# Patient Record
Sex: Male | Born: 1990
Health system: Southern US, Community
[De-identification: ages and names within clinical notes are randomized; demographics above are authoritative.]

## PROBLEM LIST (undated history)

## (undated) ENCOUNTER — Emergency Department (HOSPITAL_COMMUNITY): Admission: EM | Payer: BC Managed Care – PPO | Source: Home / Self Care

## (undated) DIAGNOSIS — D6800 Von Willebrand disease, unspecified: Secondary | ICD-10-CM

## (undated) HISTORY — DX: Von Willebrand's disease: D68.0

## (undated) HISTORY — DX: Von Willebrand disease, unspecified: D68.00

---

## 2012-02-08 ENCOUNTER — Encounter (HOSPITAL_COMMUNITY): Payer: Self-pay

## 2012-02-08 ENCOUNTER — Emergency Department (INDEPENDENT_AMBULATORY_CARE_PROVIDER_SITE_OTHER): Payer: Self-pay

## 2012-02-08 ENCOUNTER — Emergency Department (HOSPITAL_COMMUNITY)
Admission: EM | Admit: 2012-02-08 | Discharge: 2012-02-08 | Disposition: A | Discharge status: Home / Self Care | Payer: Self-pay | Source: Home / Self Care | Attending: Emergency Medicine | Admitting: Emergency Medicine

## 2012-02-08 DIAGNOSIS — S92919A Unspecified fracture of unspecified toe(s), initial encounter for closed fracture: Secondary | ICD-10-CM

## 2012-02-08 DIAGNOSIS — S92513A Displaced fracture of proximal phalanx of unspecified lesser toe(s), initial encounter for closed fracture: Secondary | ICD-10-CM

## 2012-02-08 MED ORDER — TRAMADOL HCL 50 MG PO TABS: 100.0000 mg | ORAL_TABLET | Freq: Three times a day (TID) | ORAL | Status: AC | PRN

## 2012-02-08 NOTE — ED Notes (Signed)
Pt was playing with his dog and hit lt foot on table, pain, bruising and swelling of lt third, fourth and fifth toe.

## 2012-02-08 NOTE — ED Provider Notes (Signed)
Chief Complaint  Patient presents with  . Toe Pain    History of Present Illness:   The patient is a 21 year old male who injured his left foot last night while playing with his dog at home. He struck his foot against a piece of furniture and since then has had pain, swelling, and bruising at the base of the third fourth and fifth toes extending up into the toes and pain with movement of the toes he has pain with ambulation. It keeps him awake at night.  Review of Systems:  Other than noted above, the patient denies any of the following symptoms: Systemic:  No fevers, chills, sweats, or aches.  No fatigue or tiredness. Musculoskeletal:  No joint pain, arthritis, bursitis, swelling, back pain, or neck pain. Neurological:  No muscular weakness, paresthesias, headache, or trouble with speech or coordination.  No dizziness.   PMFSH:  Past medical history, family history, social history, meds, and allergies were reviewed.  Physical Exam:   Vital signs:  BP 158/69  Pulse 118  Temp(Src) 98.1 F (36.7 C) (Oral)  Resp 14  SpO2 98% Gen:  Alert and oriented times 3.  In no distress. Musculoskeletal: There was bruising, swelling, and pain to palpation over the third, fourth, and fifth toes extending up to the metatarsals. Otherwise, all joints had a full a ROM with no swelling, bruising or deformity.  No edema, pulses full. Extremities were warm and pink.  Capillary refill was brisk.  Skin:  Clear, warm and dry.  No rash. Neuro:  Alert and oriented times 3.  Muscle strength was normal.  Sensation was intact to light touch.   Radiology:  Dg Foot Complete Left  02/08/2012  *RADIOLOGY REPORT*  Clinical Data: Injured left foot yesterday.  Persistent pain and bruising.  LEFT FOOT - COMPLETE 3+ VIEW 02/08/2012:  Comparison: None.  Findings: Comminuted fracture involving the base of proximal phalanx of the 4th and 5th toes, extending to the articular surface.  No other fractures.  Well-preserved bone  mineral density. Well-preserved joint spaces.  IMPRESSION: Comminuted fractures involving the bases of the proximal phalanges of the 4th and 5th toes.  Original Report Authenticated By: Arnell Sieving, M.D.   Course in Urgent Care Center:   The 3 toes were buddy taped and the patient was put in a postop boot.  Assessment:   Diagnoses that have been ruled out:  None  Diagnoses that are still under consideration:  None  Final diagnoses:  Displaced fracture of proximal phalanx of lesser toe    Plan:   1.  The following meds were prescribed:   New Prescriptions   TRAMADOL (ULTRAM) 50 MG TABLET    Take 2 tablets (100 mg total) by mouth every 8 (eight) hours as needed for pain.   2.  The patient was instructed in symptomatic care, including rest and activity, elevation, application of ice and compression.  Appropriate handouts were given. 3.  The patient was told to return if becoming worse in any way, if no better in 3 or 4 days, and given some red flag symptoms that would indicate earlier return.   4.  The patient was told to follow up if no better in 6 weeks.   Reuben Likes, MD 02/08/12 [phone]

## 2012-02-08 NOTE — Discharge Instructions (Signed)
Toe Fracture  Your caregiver has diagnosed you as having a fractured toe. A toe fracture is a break in the bone of a toe. "Buddy taping" is a way of splinting your broken toe, by taping the broken toe to the toe next to it. This "buddy taping" will keep the injured toe from moving beyond normal range of motion. Buddy taping also helps the toe heal in a more normal alignment. It may take 6 to 8 weeks for the toe injury to heal.  HOME CARE INSTRUCTIONS   · Leave your toes taped together for as long as directed by your caregiver or until you see a doctor for a follow-up examination. You can change the tape after bathing. Always use a small piece of gauze or cotton between the toes when taping them together. This will help the skin stay dry and prevent infection.  · Apply ice to the injury for 15 to 20 minutes each hour while awake for the first 2 days. Put the ice in a plastic bag and place a towel between the bag of ice and your skin.  · After the first 2 days, apply heat to the injured area. Use heat for the next 2 to 3 days. Place a heating pad on the foot or soak the foot in warm water as directed by your caregiver.  · Keep your foot elevated as much as possible to lessen swelling.  · Wear sturdy, supportive shoes. The shoes should not pinch the toes or fit tightly against the toes.  · Your caregiver may prescribe a rigid shoe if your foot is very swollen.  · Your may be given crutches if the pain is too great and it hurts too much to walk.  · Only take over-the-counter or prescription medicines for pain, discomfort, or fever as directed by your caregiver.  · If your caregiver has given you a follow-up appointment, it is very important to keep that appointment. Not keeping the appointment could result in a chronic or permanent injury, pain, and disability. If there is any problem keeping the appointment, you must call back to this facility for assistance.  SEEK MEDICAL CARE IF:   · You have increased pain or  swelling, not relieved with medications.  · The pain does not get better after 1 week.  · Your injured toe is cold when the others are warm.  SEEK IMMEDIATE MEDICAL CARE IF:   · The toe becomes cold, numb, or white.  · The toe becomes hot (inflamed) and red.  Document Released: 10/31/2000 Document Revised: 10/23/2011 Document Reviewed: 06/19/2008  [employer]® Patient Information ©2012 [employer].

## 2014-11-27 ENCOUNTER — Telehealth: Payer: Self-pay | Admitting: Internal Medicine

## 2014-11-27 NOTE — Telephone Encounter (Signed)
S/W PATIENT AND GAVE NP APPT FOR 01/19 @ 1:45 W/DR. MOHAMED REFERRING DR. CAFFEY DX- HX OF BLEEDING DISORDER

## 2014-12-04 ENCOUNTER — Other Ambulatory Visit: Payer: Self-pay | Admitting: Medical Oncology

## 2014-12-04 DIAGNOSIS — D699 Hemorrhagic condition, unspecified: Secondary | ICD-10-CM

## 2014-12-05 ENCOUNTER — Other Ambulatory Visit: Payer: Self-pay | Admitting: Internal Medicine

## 2014-12-05 ENCOUNTER — Other Ambulatory Visit (HOSPITAL_BASED_OUTPATIENT_CLINIC_OR_DEPARTMENT_OTHER): Payer: BC Managed Care – PPO

## 2014-12-05 ENCOUNTER — Encounter: Payer: Self-pay | Admitting: Internal Medicine

## 2014-12-05 ENCOUNTER — Ambulatory Visit (HOSPITAL_BASED_OUTPATIENT_CLINIC_OR_DEPARTMENT_OTHER): Payer: BC Managed Care – PPO

## 2014-12-05 ENCOUNTER — Ambulatory Visit (HOSPITAL_BASED_OUTPATIENT_CLINIC_OR_DEPARTMENT_OTHER): Payer: BC Managed Care – PPO | Admitting: Internal Medicine

## 2014-12-05 ENCOUNTER — Telehealth: Payer: Self-pay | Admitting: Internal Medicine

## 2014-12-05 ENCOUNTER — Ambulatory Visit: Payer: Self-pay

## 2014-12-05 ENCOUNTER — Encounter (INDEPENDENT_AMBULATORY_CARE_PROVIDER_SITE_OTHER): Payer: Self-pay

## 2014-12-05 VITALS — BP 155/60 | HR 88 | Temp 98.0°F | Resp 19 | Ht 72.0 in | Wt 255.6 lb

## 2014-12-05 DIAGNOSIS — Z01818 Encounter for other preprocedural examination: Secondary | ICD-10-CM

## 2014-12-05 DIAGNOSIS — R58 Hemorrhage, not elsewhere classified: Secondary | ICD-10-CM

## 2014-12-05 DIAGNOSIS — Z130 Encounter for screening for diseases of the blood and blood-forming organs and certain disorders involving the immune mechanism: Secondary | ICD-10-CM

## 2014-12-05 DIAGNOSIS — D699 Hemorrhagic condition, unspecified: Secondary | ICD-10-CM

## 2014-12-05 LAB — CBC WITH DIFFERENTIAL/PLATELET
BASO%: 0.2 % (ref 0.0–2.0)
Basophils Absolute: 0 10*3/uL (ref 0.0–0.1)
EOS ABS: 0 10*3/uL (ref 0.0–0.5)
EOS%: 0.4 % (ref 0.0–7.0)
HEMATOCRIT: 49.4 % (ref 38.4–49.9)
HGB: 17.8 g/dL — ABNORMAL HIGH (ref 13.0–17.1)
LYMPH#: 1.6 10*3/uL (ref 0.9–3.3)
LYMPH%: 17 % (ref 14.0–49.0)
MCH: 30.9 pg (ref 27.2–33.4)
MCHC: 36 g/dL (ref 32.0–36.0)
MCV: 85.8 fL (ref 79.3–98.0)
MONO#: 0.7 10*3/uL (ref 0.1–0.9)
MONO%: 7 % (ref 0.0–14.0)
NEUT#: 7.2 10*3/uL — ABNORMAL HIGH (ref 1.5–6.5)
NEUT%: 75.4 % — AB (ref 39.0–75.0)
RBC: 5.76 10*6/uL (ref 4.20–5.82)
RDW: 12.7 % (ref 11.0–14.6)
WBC: 9.5 10*3/uL (ref 4.0–10.3)

## 2014-12-05 LAB — PROTIME-INR
INR: 1.1 — AB (ref 2.00–3.50)
Protime: 13.2 Seconds (ref 10.6–13.4)

## 2014-12-05 LAB — PLATELET FUNCTION ASSAY: COLLAGEN / EPINEPHRINE: 184 s (ref 0–184)

## 2014-12-05 LAB — COMPREHENSIVE METABOLIC PANEL (CC13)
ALT: 70 U/L — ABNORMAL HIGH (ref 0–55)
ANION GAP: 11 meq/L (ref 3–11)
AST: 42 U/L — AB (ref 5–34)
Albumin: 5.2 g/dL — ABNORMAL HIGH (ref 3.5–5.0)
Alkaline Phosphatase: 81 U/L (ref 40–150)
BUN: 22.3 mg/dL (ref 7.0–26.0)
CHLORIDE: 105 meq/L (ref 98–109)
CO2: 25 meq/L (ref 22–29)
CREATININE: 1.1 mg/dL (ref 0.7–1.3)
Calcium: 10 mg/dL (ref 8.4–10.4)
EGFR: >90 mL/min/{1.73_m2} (ref >90)
Glucose: 86 mg/dl (ref 70–140)
Potassium: 3.9 mEq/L (ref 3.5–5.1)
SODIUM: 141 meq/L (ref 136–145)
TOTAL PROTEIN: 8.1 g/dL (ref 6.4–8.3)
Total Bilirubin: 0.56 mg/dL (ref 0.20–1.20)

## 2014-12-05 NOTE — Progress Notes (Signed)
[place] [phone]   [phone]  CONSULT NOTE  REFERRING PHYSICIAN: Dr. Flossie Dibble  REASON FOR CONSULTATION:  24 years old male with bleeding disorder for evaluation before surgery  HPI Kevin Robbins is a 24 y.o. male with no significant past medical history except for questionable platelet disorder in his childhood. He mentioned that his father and sister also had platelet disorder, questionable von Willebrand disease. He mentioned that his father was diagnosed several years ago with platelet dysfunction before a surgical procedure that was aborted. His sister also used to have heavy menstrual bleeding. Years ago, the patient used to have a lot of nosebleed but not in the last few years. He is very active and denied having any significant bleeding, bruises or ecchymosis. He is not on any active treatment except over the counter nutritional supplements for body building. He was recently seen by Dr. 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 He was scheduled for arthroscopy but this is currently on hold for evaluation of his history of bleeding disorder.  The patient denied having any significant complaints today. He denied having any significant weight loss or night sweats. He has no chest pain, shortness breath, cough or hemoptysis. He has no headache or visual changes. He was started recently on Celebrex by Dr. French Ana.  HPI PAST MEDICAL HISTORY: Significant for history of platelet dysfunction in early childhood. No history of hypertension, diabetes mellitus, coronary artery disease, stroke or dyslipidemia   FAMILY HISTORY: Father and sister with platelet dysfunction, questionable von Willebrand.   SOCIAL HISTORY: The patient is married and was accompanied today by his wife [name]. He has no children. 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 He has no history of smoking, alcohol or drug abuse.   Social  History History  Substance Use Topics  . Smoking status: Never Smoker   . Smokeless tobacco: Not on file  . Alcohol Use: No    No Known Allergies  Current Outpatient Prescriptions  Medication Sig Dispense Refill  . acetaminophen (TYLENOL) 325 MG tablet Take 650 mg by mouth every 6 (six) hours as needed.     No current facility-administered medications for this visit.    Review of Systems  Constitutional: negative Eyes: negative Ears, nose, mouth, throat, and face: negative Respiratory: negative Cardiovascular: negative Gastrointestinal: negative Genitourinary:negative Integument/breast: negative Hematologic/lymphatic: negative Musculoskeletal:negative Neurological: negative Behavioral/Psych: negative Endocrine: negative Allergic/Immunologic: negative  Physical Exam  QXI:HWTUU, healthy, no distress, well nourished and well developed SKIN: skin color, texture, turgor are normal, no rashes or significant lesions HEAD: Normocephalic, No masses, lesions, tenderness or abnormalities EYES: normal, PERRLA EARS: External ears normal, Canals clear OROPHARYNX:no exudate, no erythema and lips, buccal mucosa, and tongue normal  NECK: supple, no adenopathy LYMPH:  no palpable lymphadenopathy, no hepatosplenomegaly LUNGS: clear to auscultation , and palpation HEART: regular rate & rhythm, no murmurs and no gallops ABDOMEN:abdomen soft, non-tender, obese, normal bowel sounds and no masses or organomegaly BACK: Back symmetric, no curvature., No CVA tenderness EXTREMITIES:no joint deformities, effusion, or inflammation, no edema, no skin discoloration, no clubbing  NEURO: alert & oriented x 3 with fluent speech, no focal motor/sensory deficits  PERFORMANCE STATUS: ECOG 0  LABORATORY DATA: Lab Results  Component Value Date   WBC 9.5 12/05/2014   HGB 17.8* 12/05/2014   HCT 49.4 12/05/2014   MCV 85.8 12/05/2014   PLT 126 Large platelets present* 12/05/2014      Chemistry  Component Value Date/Time   NA 141 12/05/2014 1322   K 3.9 12/05/2014 1322   CO2 25 12/05/2014 1322   BUN 22.3 12/05/2014 1322   CREATININE 1.1 12/05/2014 1322      Component Value Date/Time   CALCIUM 10.0 12/05/2014 1322   ALKPHOS 81 12/05/2014 1322   AST 42* 12/05/2014 1322   ALT 70* 12/05/2014 1322   BILITOT 0.56 12/05/2014 1322       RADIOGRAPHIC STUDIES: No results found.  ASSESSMENT: This is a very pleasant 24 years old Hispanic male with history of platelet dysfunction questionable for von Willebrand diagnosed in his childhood with the family history of platelet dysfunction. He is here today for evaluation and clearance before a surgical arthroscopic procedure for the left knee. The patient is currently asymptomatic and has no bleeding issues over the last several years.   PLAN: I had a lengthy discussion with the patient and his wife today about his condition. I think the patient may have a mild von Willebrand disease. I ordered several studies today to evaluate his condition including repeat CBC, comprehensive metabolic panel, PT/INR, PTT, von Willebrand panel with multimer's, platelet aggregation and platelet function assay. I will see the patient back for follow-up visit in less than 2 weeks for reevaluation and discussion of his lab results and further recommendation regarding his condition. The patient and his wife the time to ask questions and I answered them completely to their satisfaction. He was advised to call immediately if he has any concerning symptoms in the interval.  The patient voices understanding of current disease status and treatment options and is in agreement with the current care plan.  All questions were answered. The patient knows to call the clinic with any problems, questions or concerns. We can certainly see the patient much sooner if necessary.  Thank you so much for allowing me to participate in the care of [facility]. I will continue to  follow up the patient with you and assist in his care.  I spent 40 minutes counseling the patient face to face. The total time spent in the appointment was 55 minutes.  Disclaimer: This note was dictated with voice recognition software. Similar sounding words can inadvertently be transcribed and may not be corrected upon review.   Naiomi Musto K. 12/05/2014, 2:58 PM

## 2014-12-05 NOTE — Progress Notes (Signed)
Checked in new patient with no issues prior to seeing the dr. He has appt card  °

## 2014-12-05 NOTE — Telephone Encounter (Signed)
Gave avs & calendar for February. °

## 2014-12-06 ENCOUNTER — Other Ambulatory Visit: Payer: BC Managed Care – PPO

## 2014-12-06 ENCOUNTER — Other Ambulatory Visit: Payer: Self-pay | Admitting: Internal Medicine

## 2014-12-11 LAB — PLATELET AGGREGATION STUDY, BLOOD
ADP10: NORMAL
ARACHIDONIC ACID: NORMAL
Collagen Aggregation: NORMAL
Ristocetin: NORMAL
Thrombin Receptor: NORMAL

## 2014-12-11 LAB — VON WILLEBRAND FACTOR MULTIMER
FACTOR-VIII ACTIVITY: 113 % (ref 50–180)
Ristocetin Co-Factor: 129 % (ref 42–200)
VON WILLEBRAND FACTOR AG: 118 % (ref 50–217)

## 2014-12-11 LAB — APTT: APTT: 32 s (ref 24–37)

## 2014-12-18 ENCOUNTER — Telehealth: Payer: Self-pay | Admitting: Medical Oncology

## 2014-12-18 NOTE — Telephone Encounter (Signed)
Wanting lab results. i told him to keep appt for wed to get results.

## 2014-12-20 ENCOUNTER — Encounter: Payer: Self-pay | Admitting: Internal Medicine

## 2014-12-20 ENCOUNTER — Telehealth: Payer: Self-pay | Admitting: *Deleted

## 2014-12-20 ENCOUNTER — Ambulatory Visit (HOSPITAL_BASED_OUTPATIENT_CLINIC_OR_DEPARTMENT_OTHER): Payer: BC Managed Care – PPO | Admitting: Internal Medicine

## 2014-12-20 VITALS — BP 152/77 | HR 108 | Temp 98.4°F | Resp 21 | Ht 72.0 in | Wt 256.6 lb

## 2014-12-20 DIAGNOSIS — D696 Thrombocytopenia, unspecified: Secondary | ICD-10-CM | POA: Insufficient documentation

## 2014-12-20 NOTE — Progress Notes (Signed)
[place] [phone]   [phone]  OFFICE PROGRESS NOTE  No PCP Per Patient No address on file  DIAGNOSIS: Questionable bleeding disorder  PRIOR THERAPY: None  CURRENT THERAPY: None  INTERVAL HISTORY: Kevin Robbins 24 y.o. male returns to the clinic today for follow-up visit. The patient is feeling fine today was no specific complaints. He is here for evaluation for questionable bleeding disorder in his family before arthroscopic surgery of his left knee. I ordered several studies to evaluate this patient for any bleeding abnormalities. His PT, PTT, von Willebrand as well as platelet function were within the normal range. He has mild thrombocytopenia with platelets count of 126,000. The patient has no other concerning issues at this point.  MEDICAL HISTORY:History reviewed. No pertinent past medical history.  ALLERGIES:  has No Known Allergies.  MEDICATIONS:  Current Outpatient Prescriptions  Medication Sig Dispense Refill  . acetaminophen (TYLENOL) 325 MG tablet Take 650 mg by mouth every 6 (six) hours as needed.     No current facility-administered medications for this visit.    SURGICAL HISTORY: History reviewed. No pertinent past surgical history.  REVIEW OF SYSTEMS:  A comprehensive review of systems was negative.   PHYSICAL EXAMINATION: General appearance: alert, cooperative and no distress Head: Normocephalic, without obvious abnormality, atraumatic Neck: no adenopathy, no JVD, supple, symmetrical, trachea midline and thyroid not enlarged, symmetric, no tenderness/mass/nodules Lymph nodes: Cervical, supraclavicular, and axillary nodes normal. Resp: clear to auscultation bilaterally Back: symmetric, no curvature. ROM normal. No CVA tenderness. Cardio: regular rate and rhythm, S1, S2 normal, no murmur, click, rub or gallop GI: soft, non-tender; bowel sounds normal; no masses,  no organomegaly Extremities: extremities normal,  atraumatic, no cyanosis or edema  ECOG PERFORMANCE STATUS: 0 - Asymptomatic  Blood pressure 152/77, pulse 108, temperature 98.4 F (36.9 C), temperature source Oral, resp. rate 21, height 6' (1.829 m), weight 256 lb 9.6 oz (116.393 kg), SpO2 100 %.  LABORATORY DATA: Lab Results  Component Value Date   WBC 9.5 12/05/2014   HGB 17.8* 12/05/2014   HCT 49.4 12/05/2014   MCV 85.8 12/05/2014   PLT 126 Large platelets present* 12/05/2014      Chemistry      Component Value Date/Time   NA 141 12/05/2014 1322   K 3.9 12/05/2014 1322   CO2 25 12/05/2014 1322   BUN 22.3 12/05/2014 1322   CREATININE 1.1 12/05/2014 1322      Component Value Date/Time   CALCIUM 10.0 12/05/2014 1322   ALKPHOS 81 12/05/2014 1322   AST 42* 12/05/2014 1322   ALT 70* 12/05/2014 1322   BILITOT 0.56 12/05/2014 1322       RADIOGRAPHIC STUDIES: No results found.  ASSESSMENT AND PLAN: This is a very pleasant 24 years old Hispanic male with mild thrombocytopenia and no other significant bleeding issues based on the recent blood work, including PT, PTT, von Willebrand and platelet function. I discussed the lab result with the patient today give him a copy of his report. I don't see any significant contraindication or concern for this patient to proceed with the arthroscopic left knee surgery. I don't see a need to continue seeing the patient at regular basis but I'll be happy to see him in the future if needed. He was advised to call immediately if he has any concerning symptoms in the interval. The patient voices understanding of current disease status and treatment options and is in agreement with the current care plan.  All questions were answered. The patient knows to call the clinic with any problems, questions or concerns. We can certainly see the patient much sooner if necessary.  Disclaimer: This note was dictated with voice recognition software. Similar sounding words can inadvertently be transcribed  and may not be corrected upon review.

## 2014-12-20 NOTE — Telephone Encounter (Signed)
Pre-operative clearance from [place] filled out by Dr Vista Mink and faxed to Dr Scot Dock at [phone]

## 2015-07-13 ENCOUNTER — Emergency Department (HOSPITAL_COMMUNITY)
Admission: EM | Admit: 2015-07-13 | Discharge: 2015-07-13 | Disposition: A | Discharge status: Home / Self Care | Payer: BLUE CROSS/BLUE SHIELD | Attending: Emergency Medicine | Admitting: Emergency Medicine

## 2015-07-13 ENCOUNTER — Encounter (HOSPITAL_COMMUNITY): Payer: Self-pay | Admitting: Emergency Medicine

## 2015-07-13 DIAGNOSIS — M545 Low back pain: Secondary | ICD-10-CM | POA: Diagnosis present

## 2015-07-13 DIAGNOSIS — G8918 Other acute postprocedural pain: Secondary | ICD-10-CM

## 2015-07-13 LAB — CBC WITH DIFFERENTIAL/PLATELET
Basophils Absolute: 0 10*3/uL (ref 0.0–0.1)
Basophils Relative: 0 % (ref 0–1)
EOS ABS: 0 10*3/uL (ref 0.0–0.7)
EOS PCT: 0 % (ref 0–5)
HCT: 40.6 % (ref 39.0–52.0)
Hemoglobin: 14.5 g/dL (ref 13.0–17.0)
LYMPHS ABS: 1.1 10*3/uL (ref 0.7–4.0)
Lymphocytes Relative: 9 % — ABNORMAL LOW (ref 12–46)
MCH: 30.2 pg (ref 26.0–34.0)
MCHC: 35.7 g/dL (ref 30.0–36.0)
MCV: 84.6 fL (ref 78.0–100.0)
MONO ABS: 0.3 10*3/uL (ref 0.1–1.0)
MONOS PCT: 2 % — AB (ref 3–12)
Neutro Abs: 10.3 10*3/uL — ABNORMAL HIGH (ref 1.7–7.7)
Neutrophils Relative %: 89 % — ABNORMAL HIGH (ref 43–77)
PLATELETS: 168 10*3/uL (ref 150–400)
RBC: 4.8 MIL/uL (ref 4.22–5.81)
RDW: 13.4 % (ref 11.5–15.5)
WBC: 11.6 10*3/uL — ABNORMAL HIGH (ref 4.0–10.5)

## 2015-07-13 LAB — COMPREHENSIVE METABOLIC PANEL
ALT: 38 U/L (ref 17–63)
AST: 34 U/L (ref 15–41)
Albumin: 4.1 g/dL (ref 3.5–5.0)
Alkaline Phosphatase: 55 U/L (ref 38–126)
Anion gap: 9 (ref 5–15)
BUN: 18 mg/dL (ref 6–20)
CHLORIDE: 104 mmol/L (ref 101–111)
CO2: 22 mmol/L (ref 22–32)
CREATININE: 1.09 mg/dL (ref 0.61–1.24)
Calcium: 9.4 mg/dL (ref 8.9–10.3)
GFR calc Af Amer: >60 mL/min (ref >60)
GLUCOSE: 178 mg/dL — AB (ref 65–99)
Potassium: 4.2 mmol/L (ref 3.5–5.1)
SODIUM: 135 mmol/L (ref 135–145)
Total Bilirubin: 0.8 mg/dL (ref 0.3–1.2)
Total Protein: 6.7 g/dL (ref 6.5–8.1)

## 2015-07-13 MED ORDER — IBUPROFEN 400 MG PO TABS
400.0000 mg | ORAL_TABLET | Freq: Once | ORAL | Status: AC
  Administered 2015-07-13: 400 mg via ORAL
  Filled 2015-07-13: qty 1

## 2015-07-13 NOTE — ED Provider Notes (Addendum)
CSN: [id number]     Arrival date & time 07/13/15  1926 History   First MD Initiated Contact with Patient 07/13/15 2038     Chief Complaint  Patient presents with  . Post-op Problem  . Back Pain     (Consider location/radiation/quality/duration/timing/severity/associated sxs/prior Treatment) HPI Complains of right flank pain at site of lipoma removal earlier today. Accompanied by increased swelling at right flank. Pain is worse with pressing on the ear. He denies fever denies abdominal pain. No other associated symptoms. He treated himself with 3 Percocets meekly prior to coming here without adequate pain relief. Pain is moderate to severe present. History reviewed. No pertinent past medical history. past history negative History reviewed. No pertinent past surgical history. past surgical history lipoma removal, knee surgery History reviewed. No pertinent family history. Social History  Substance Use Topics  . Smoking status: Never Smoker   . Smokeless tobacco: None  . Alcohol Use: No    no tobacco no alcohol or drugs Review of Systems  Constitutional: Negative.   HENT: Negative.   Respiratory: Negative.   Cardiovascular: Negative.   Gastrointestinal: Negative.   Genitourinary: Positive for flank pain.  Musculoskeletal: Negative.   Skin: Negative.   Neurological: Negative.   Psychiatric/Behavioral: Negative.   All other systems reviewed and are negative.     Allergies  Review of patient's allergies indicates no known allergies.  Home Medications   Prior to Admission medications   Medication Sig Start Date End Date Taking? Authorizing Provider  acetaminophen (TYLENOL) 325 MG tablet Take 650 mg by mouth every 6 (six) hours as needed.    Historical Provider, MD   BP 120/73 mmHg  Pulse 116  Temp(Src) 98 F (36.7 C) (Oral)  Resp 18  SpO2 98% Physical Exam  Constitutional: He appears well-developed and well-nourished. No distress.  HENT:  Head: Normocephalic and  atraumatic.  Eyes: Conjunctivae are normal. Pupils are equal, round, and reactive to light.  Neck: Neck supple. No tracheal deviation present. No thyromegaly present.  Cardiovascular: Normal rate and regular rhythm.   No murmur heard. Heart rate counted at 88 by me  Pulmonary/Chest: Effort normal and breath sounds normal.  Abdominal: Soft. Bowel sounds are normal. He exhibits no distension. There is no tenderness.  Musculoskeletal: Normal range of motion. He exhibits no edema or tenderness.  Well approximated surgical wound at right flank with out drainage. No redness or warmth. Flank is moderately swollen and tender.  Neurological: He is alert. Coordination normal.  Skin: Skin is warm and dry. No rash noted.  Psychiatric: He has a normal mood and affect.  Nursing note and vitals reviewed.   ED Course  Procedures (including critical care time) Labs Review Labs Reviewed  CBC WITH DIFFERENTIAL/PLATELET - Abnormal; Notable for the following:    WBC 11.6 (*)    Neutrophils Relative % 89 (*)    Neutro Abs 10.3 (*)    Lymphocytes Relative 9 (*)    Monocytes Relative 2 (*)    All other components within normal limits  COMPREHENSIVE METABOLIC PANEL    Imaging Review No results found. I have personally reviewed and evaluated these images and lab results as part of my medical decision-making.   EKG Interpretation None      Results for orders placed or performed during the hospital encounter of 07/13/15  Comprehensive metabolic panel  Result Value Ref Range   Sodium 135 135 - 145 mmol/L   Potassium 4.2 3.5 - 5.1 mmol/L   Chloride 104  101 - 111 mmol/L   CO2 22 22 - 32 mmol/L   Glucose, Bld 178 (H) 65 - 99 mg/dL   BUN 18 6 - 20 mg/dL   Creatinine, Ser 1.09 0.61 - 1.24 mg/dL   Calcium 9.4 8.9 - 10.3 mg/dL   Total Protein 6.7 6.5 - 8.1 g/dL   Albumin 4.1 3.5 - 5.0 g/dL   AST 34 15 - 41 U/L   ALT 38 17 - 63 U/L   Alkaline Phosphatase 55 38 - 126 U/L   Total Bilirubin 0.8 0.3 -  1.2 mg/dL   GFR calc non Af Amer >60 >60 mL/min   GFR calc Af Amer >60 >60 mL/min   Anion gap 9 5 - 15  CBC with Differential  Result Value Ref Range   WBC 11.6 (H) 4.0 - 10.5 K/uL   RBC 4.80 4.22 - 5.81 MIL/uL   Hemoglobin 14.5 13.0 - 17.0 g/dL   HCT 40.6 39.0 - 52.0 %   MCV 84.6 78.0 - 100.0 fL   MCH 30.2 26.0 - 34.0 pg   MCHC 35.7 30.0 - 36.0 g/dL   RDW 13.4 11.5 - 15.5 %   Platelets 168 150 - 400 K/uL   Neutrophils Relative % 89 (H) 43 - 77 %   Neutro Abs 10.3 (H) 1.7 - 7.7 K/uL   Lymphocytes Relative 9 (L) 12 - 46 %   Lymphs Abs 1.1 0.7 - 4.0 K/uL   Monocytes Relative 2 (L) 3 - 12 %   Monocytes Absolute 0.3 0.1 - 1.0 K/uL   Eosinophils Relative 0 0 - 5 %   Eosinophils Absolute 0.0 0.0 - 0.7 K/uL   Basophils Relative 0 0 - 1 %   Basophils Absolute 0.0 0.0 - 0.1 K/uL   No results found.   MDM  I suspect the patient has postoperative hematoma. I spoke with Dr. Barry Dienes. No further intervention needed. Will likely resolve on its own. Patient is instructed that he can take Percocet 2 every 4 hours as needed for pain. He can supplement pain medication with Advil or Aleve. Follow-up with [facility] surgery in 3 days if continuing to have significant pain or swelling. Return if condition worsens Diagnosis #1postoperative pain and swelling #2 hyperglycemia Final diagnoses:  None        Orlie Dakin, MD 07/13/15 4854  Orlie Dakin, MD 07/13/15 2105

## 2015-07-13 NOTE — ED Notes (Signed)
Patient here post lipoma removal from right flank this AM. States that area has hurt since he came out of anesthesia, but since that time the area has become swollen to a size much larger than it was prior to surgery. Additionally the pain has intensified and is radiating to left side and laterally. Area appears grossly swollen. Tachycardic in triage. No obvious drainage or discoloration.

## 2015-07-13 NOTE — ED Notes (Signed)
Bandaged was changed over surgical site on right lower back by this RN and sterile non adhesive dressing reapplied. Pt tolerated well.

## 2015-07-13 NOTE — Discharge Instructions (Signed)
You can take Advil or Aleve in addition to Percocet up to 2 tablets every 4 hours as needed for pain. Hold ice pack over the area 4 times daily 30 minutes at a time. Call Dr.Gerkin's office if having significant pain or swelling by Monday, 07/16/2015. Return if you develop fever, vomiting or feel worse for any reason. Your blood sugar was elevated tonight at 178. You may be borderline diabetic. Ask your primary care physician to order a test known his hemoglobin A1c to check you for diabetes

## 2015-07-13 NOTE — ED Notes (Signed)
Pt verbalized understanding of d/c instructions and has no further questions. Pt stable and NAD.  

## 2015-11-18 HISTORY — PX: KNEE SURGERY: SHX244

## 2016-11-17 HISTORY — PX: KNEE SURGERY: SHX244

## 2016-11-17 HISTORY — PX: OTHER SURGICAL HISTORY: SHX169

## 2017-10-24 ENCOUNTER — Emergency Department (HOSPITAL_COMMUNITY): Payer: Self-pay

## 2017-10-24 ENCOUNTER — Emergency Department (HOSPITAL_COMMUNITY)
Admission: EM | Admit: 2017-10-24 | Discharge: 2017-10-24 | Disposition: A | Discharge status: Home / Self Care | Payer: Self-pay | Attending: Emergency Medicine | Admitting: Emergency Medicine

## 2017-10-24 ENCOUNTER — Encounter (HOSPITAL_COMMUNITY): Payer: Self-pay | Admitting: Emergency Medicine

## 2017-10-24 DIAGNOSIS — M5441 Lumbago with sciatica, right side: Secondary | ICD-10-CM | POA: Diagnosis not present

## 2017-10-24 DIAGNOSIS — Y9339 Activity, other involving climbing, rappelling and jumping off: Secondary | ICD-10-CM | POA: Insufficient documentation

## 2017-10-24 DIAGNOSIS — Y929 Unspecified place or not applicable: Secondary | ICD-10-CM | POA: Insufficient documentation

## 2017-10-24 DIAGNOSIS — M545 Low back pain: Secondary | ICD-10-CM | POA: Diagnosis not present

## 2017-10-24 DIAGNOSIS — W293XXA Contact with powered garden and outdoor hand tools and machinery, initial encounter: Secondary | ICD-10-CM | POA: Insufficient documentation

## 2017-10-24 DIAGNOSIS — S3992XA Unspecified injury of lower back, initial encounter: Secondary | ICD-10-CM | POA: Diagnosis not present

## 2017-10-24 MED ORDER — PREDNISONE 20 MG PO TABS
60.0000 mg | ORAL_TABLET | Freq: Once | ORAL | Status: DC
  Filled 2017-10-24: qty 3

## 2017-10-24 MED ORDER — CYCLOBENZAPRINE HCL 10 MG PO TABS: 10.0000 mg | ORAL_TABLET | Freq: Two times a day (BID) | ORAL | 0 refills | Status: DC | PRN

## 2017-10-24 MED ORDER — CYCLOBENZAPRINE HCL 10 MG PO TABS
10.0000 mg | ORAL_TABLET | Freq: Once | ORAL | Status: AC
  Administered 2017-10-24: 10 mg via ORAL
  Filled 2017-10-24: qty 1

## 2017-10-24 MED ORDER — DEXAMETHASONE SODIUM PHOSPHATE 10 MG/ML IJ SOLN
10.0000 mg | Freq: Once | INTRAMUSCULAR | Status: AC
  Administered 2017-10-24: 10 mg via INTRAMUSCULAR
  Filled 2017-10-24: qty 1

## 2017-10-24 MED ORDER — DICLOFENAC SODIUM 50 MG PO TBEC: 50.0000 mg | DELAYED_RELEASE_TABLET | Freq: Two times a day (BID) | ORAL | 0 refills | Status: DC

## 2017-10-24 MED ORDER — OXYCODONE-ACETAMINOPHEN 5-325 MG PO TABS
1.0000 | ORAL_TABLET | Freq: Once | ORAL | Status: AC
  Administered 2017-10-24: 1 via ORAL
  Filled 2017-10-24: qty 1

## 2017-10-24 MED ORDER — KETOROLAC TROMETHAMINE 60 MG/2ML IM SOLN
30.0000 mg | Freq: Once | INTRAMUSCULAR | Status: AC
  Administered 2017-10-24: 30 mg via INTRAMUSCULAR
  Filled 2017-10-24: qty 2

## 2017-10-24 MED ORDER — HYDROMORPHONE HCL 1 MG/ML IJ SOLN
1.0000 mg | Freq: Once | INTRAMUSCULAR | Status: AC
  Administered 2017-10-24: 1 mg via INTRAMUSCULAR
  Filled 2017-10-24: qty 1

## 2017-10-24 NOTE — Discharge Instructions (Signed)
Do not drive while taking the muscle relaxer as it will make you sleepy. Follow up with Dr. Marcelino Scot. Return here as needed.

## 2017-10-24 NOTE — ED Provider Notes (Signed)
[facility] DEPT Provider Note   CSN: [id number] Arrival date & time: 10/24/17  1538     History   Chief Complaint Chief Complaint  Patient presents with  . Back Pain    HPI Kevin Robbins is a 26 y.o. male who presents to the ED with back pain. The pain started after he jumped of the back of a truck on on the way down a leaf blower hit his back. The injury happened 2 days ago. Patient reports that it is at the area of his previous surgery for a lipoma. Patient's wife is with him and requesting MRI. I discussed with the patient that we will do his exam and depending on his exam we will determine what imaging is indicated.   HPI  History reviewed. No pertinent past medical history.  Patient Active Problem List   Diagnosis Date Noted  . Thrombocytopenia ([place]) 12/20/2014  . Bleeding 12/05/2014    History reviewed. No pertinent surgical history.     Home Medications    Prior to Admission medications   Medication Sig Start Date End Date Taking? Authorizing Provider  acetaminophen (TYLENOL) 325 MG tablet Take 650 mg by mouth every 6 (six) hours as needed.    [provider]  cyclobenzaprine (FLEXERIL) 10 MG tablet Take 1 tablet (10 mg total) by mouth 2 (two) times daily as needed for muscle spasms. 10/24/17   Ashley Murrain, NP  diclofenac (VOLTAREN) 50 MG EC tablet Take 1 tablet (50 mg total) by mouth 2 (two) times daily. 10/24/17   Ashley Murrain, NP    Family History No family history on file.  Social History Social History   Tobacco Use  . Smoking status: Never Smoker  . Smokeless tobacco: Never Used  Substance Use Topics  . Alcohol use: No  . Drug use: No     Allergies   Patient has no known allergies.   Review of Systems Review of Systems  Constitutional: Negative for chills and fever.  HENT: Negative.   Genitourinary: Negative for dysuria, frequency and urgency.       No loss of control of bladder or bowels.    Musculoskeletal: Positive for back pain. Negative for neck pain.  Skin: Negative for color change and wound.  Neurological: Negative for syncope.  Psychiatric/Behavioral: Negative for confusion.     Physical Exam Updated Vital Signs BP 132/88 (BP Location: Right Arm)   Pulse 80   Temp 98.1 F (36.7 C) (Oral)   Resp 18   Ht 6' (1.829 m)   Wt 127 kg (280 lb)   SpO2 100%   BMI 37.97 kg/m   Physical Exam  Constitutional: He appears well-developed and well-nourished. No distress.  HENT:  Head: Normocephalic and atraumatic.  Eyes: EOM are normal. Pupils are equal, round, and reactive to light.  Neck: Normal range of motion. Neck supple.  Cardiovascular: Normal rate and regular rhythm.  Pulmonary/Chest: Effort normal. No respiratory distress. He has no wheezes. He has no rales.  Abdominal: Soft. Bowel sounds are normal. There is no tenderness.  Musculoskeletal:       Lumbar back: He exhibits tenderness and spasm. He exhibits no deformity and normal pulse. Decreased range of motion: due to pain.       Back:  Scar noted to the right lower lumbar area where lipoma was removed.   Neurological: He is alert. He has normal strength. No sensory deficit. Gait normal.  Reflex Scores:  Bicep reflexes are 2+ on the right side and 2+ on the left side.      Brachioradialis reflexes are 2+ on the right side and 2+ on the left side.      Patellar reflexes are 2+ on the right side and 2+ on the left side. Skin: Skin is warm and dry.  Psychiatric: He has a normal mood and affect. His behavior is normal.  Nursing note and vitals reviewed.    ED Treatments / Results  Labs I discussed with the patient that since he has a normal neuro exam that we will x-ray his back to make sure when the leaf blower hit him it did not cause a fracture but at this time we will not do an MRI. Patient given Percocet, Toradol and Flexeril for pain management and muscle spasm. Patient continued to c/o pain.  Dilaudid 1 mg IM and Decadron 10 mg IM.  Patient reports pain much better and ready to go home.   (all labs ordered are listed, but only abnormal results are displayed) Labs Reviewed - No data to display  EKG Radiology Dg Lumbar Spine Complete  Result Date: 10/24/2017 CLINICAL DATA:  Pain following fall EXAM: LUMBAR SPINE - COMPLETE 4+ VIEW COMPARISON:  Lumbar MRI Mar 28, 2016 FINDINGS: Frontal, lateral, spot lumbosacral lateral, and bilateral oblique views were obtained. There are 5 non-rib-bearing lumbar type vertebral bodies. There is no fracture or spondylolisthesis. The disc spaces appear unremarkable. There is no appreciable facet arthropathy. IMPRESSION: No fracture or spondylolisthesis.  No evident arthropathy. Electronically Signed   By: Lowella Grip III M.D.   On: 10/24/2017 18:53    Procedures Procedures (including critical care time)  Medications Ordered in ED Medications  cyclobenzaprine (FLEXERIL) tablet 10 mg (10 mg Oral Given 10/24/17 1855)  oxyCODONE-acetaminophen (PERCOCET/ROXICET) 5-325 MG per tablet 1 tablet (1 tablet Oral Given 10/24/17 1855)  ketorolac (TORADOL) injection 30 mg (30 mg Intramuscular Given 10/24/17 1856)  HYDROmorphone (DILAUDID) injection 1 mg (1 mg Intramuscular Given 10/24/17 1951)  dexamethasone (DECADRON) injection 10 mg (10 mg Intramuscular Given 10/24/17 1955)     Initial Impression / Assessment and Plan / ED Course  I have reviewed the triage vital signs and the nursing notes. Patient with back pain.  No neurological deficits and normal neuro exam.  Patient can walk but states is painful.  No loss of bowel or bladder control.  No concern for cauda equina.  No fever, night sweats, weight loss, h/o cancer, IVDU.  RICE protocol and pain medicine indicated and discussed with patient. Patient given ortho referral.   Final Clinical Impressions(s) / ED Diagnoses   Final diagnoses:  Acute right-sided low back pain with right-sided sciatica     ED Discharge Orders        Ordered    diclofenac (VOLTAREN) 50 MG EC tablet  2 times daily     10/24/17 1930    cyclobenzaprine (FLEXERIL) 10 MG tablet  2 times daily PRN     10/24/17 1930       Debroah Baller [place], NP 10/24/17 2238    Jola Schmidt, MD 10/24/17 2333

## 2017-10-24 NOTE — ED Triage Notes (Addendum)
Patient here with complaints of back pain. Reports that left blower hit back where his surgery mark was two days ago. Pain 10/10 unable to sleep. Ambulatory. Worse with sitting.

## 2018-06-03 ENCOUNTER — Encounter (HOSPITAL_COMMUNITY): Payer: Self-pay | Admitting: *Deleted

## 2018-06-03 ENCOUNTER — Other Ambulatory Visit: Payer: Self-pay

## 2018-06-03 ENCOUNTER — Emergency Department (HOSPITAL_COMMUNITY): Payer: BLUE CROSS/BLUE SHIELD

## 2018-06-03 ENCOUNTER — Emergency Department (HOSPITAL_COMMUNITY)
Admission: EM | Admit: 2018-06-03 | Discharge: 2018-06-03 | Disposition: A | Discharge status: Home / Self Care | Payer: BLUE CROSS/BLUE SHIELD | Attending: Emergency Medicine | Admitting: Emergency Medicine

## 2018-06-03 DIAGNOSIS — S61402A Unspecified open wound of left hand, initial encounter: Secondary | ICD-10-CM | POA: Diagnosis not present

## 2018-06-03 DIAGNOSIS — D680 Von Willebrand's disease: Secondary | ICD-10-CM | POA: Insufficient documentation

## 2018-06-03 DIAGNOSIS — Z79899 Other long term (current) drug therapy: Secondary | ICD-10-CM | POA: Diagnosis not present

## 2018-06-03 DIAGNOSIS — Y998 Other external cause status: Secondary | ICD-10-CM | POA: Insufficient documentation

## 2018-06-03 DIAGNOSIS — W298XXA Contact with other powered powered hand tools and household machinery, initial encounter: Secondary | ICD-10-CM | POA: Diagnosis not present

## 2018-06-03 DIAGNOSIS — S61432A Puncture wound without foreign body of left hand, initial encounter: Secondary | ICD-10-CM | POA: Diagnosis not present

## 2018-06-03 DIAGNOSIS — Y9389 Activity, other specified: Secondary | ICD-10-CM | POA: Insufficient documentation

## 2018-06-03 DIAGNOSIS — Z23 Encounter for immunization: Secondary | ICD-10-CM | POA: Insufficient documentation

## 2018-06-03 DIAGNOSIS — Y929 Unspecified place or not applicable: Secondary | ICD-10-CM | POA: Insufficient documentation

## 2018-06-03 DIAGNOSIS — S61412A Laceration without foreign body of left hand, initial encounter: Secondary | ICD-10-CM

## 2018-06-03 DIAGNOSIS — S61442A Puncture wound with foreign body of left hand, initial encounter: Secondary | ICD-10-CM | POA: Diagnosis not present

## 2018-06-03 DIAGNOSIS — S61032A Puncture wound without foreign body of left thumb without damage to nail, initial encounter: Secondary | ICD-10-CM | POA: Diagnosis not present

## 2018-06-03 LAB — BASIC METABOLIC PANEL
ANION GAP: 12 (ref 5–15)
BUN: 19 mg/dL (ref 6–20)
CALCIUM: 9.3 mg/dL (ref 8.9–10.3)
CO2: 24 mmol/L (ref 22–32)
Chloride: 102 mmol/L (ref 98–111)
Creatinine, Ser: 1.16 mg/dL (ref 0.61–1.24)
GFR calc Af Amer: >60 mL/min (ref >60)
Glucose, Bld: 94 mg/dL (ref 70–99)
Potassium: 3.7 mmol/L (ref 3.5–5.1)
Sodium: 138 mmol/L (ref 135–145)

## 2018-06-03 LAB — CBC WITH DIFFERENTIAL/PLATELET
BASOS ABS: 0 10*3/uL (ref 0.0–0.1)
BASOS PCT: 0 %
Eosinophils Absolute: 0.1 10*3/uL (ref 0.0–0.7)
Eosinophils Relative: 1 %
HEMATOCRIT: 49.1 % (ref 39.0–52.0)
HEMOGLOBIN: 17.6 g/dL — AB (ref 13.0–17.0)
Lymphocytes Relative: 22 %
Lymphs Abs: 1.8 10*3/uL (ref 0.7–4.0)
MCH: 31.3 pg (ref 26.0–34.0)
MCHC: 35.8 g/dL (ref 30.0–36.0)
MCV: 87.4 fL (ref 78.0–100.0)
Monocytes Absolute: 0.7 10*3/uL (ref 0.1–1.0)
Monocytes Relative: 9 %
NEUTROS ABS: 5.7 10*3/uL (ref 1.7–7.7)
NEUTROS PCT: 68 %
Platelets: 136 10*3/uL — ABNORMAL LOW (ref 150–400)
RBC: 5.62 MIL/uL (ref 4.22–5.81)
RDW: 13.4 % (ref 11.5–15.5)
WBC: 8.4 10*3/uL (ref 4.0–10.5)

## 2018-06-03 LAB — TYPE AND SCREEN
ABO/RH(D): O NEG
Antibody Screen: NEGATIVE

## 2018-06-03 LAB — PROTIME-INR
INR: 0.97
Prothrombin Time: 12.8 seconds (ref 11.4–15.2)

## 2018-06-03 LAB — APTT: APTT: 59 s — AB (ref 24–36)

## 2018-06-03 MED ORDER — OXYCODONE-ACETAMINOPHEN 5-325 MG PO TABS: 1.0000 | ORAL_TABLET | Freq: Four times a day (QID) | ORAL | 0 refills | Status: DC | PRN

## 2018-06-03 MED ORDER — CEFAZOLIN SODIUM-DEXTROSE 1-4 GM/50ML-% IV SOLN
1.0000 g | Freq: Once | INTRAVENOUS | Status: AC
  Administered 2018-06-03: 1 g via INTRAVENOUS
  Filled 2018-06-03: qty 50

## 2018-06-03 MED ORDER — DOXYCYCLINE HYCLATE 100 MG PO CAPS: 100.0000 mg | ORAL_CAPSULE | Freq: Two times a day (BID) | ORAL | 0 refills | Status: DC

## 2018-06-03 MED ORDER — OXYCODONE-ACETAMINOPHEN 5-325 MG PO TABS
1.0000 | ORAL_TABLET | ORAL | Status: DC | PRN
  Administered 2018-06-03: 1 via ORAL

## 2018-06-03 MED ORDER — BACITRACIN ZINC 500 UNIT/GM EX OINT
TOPICAL_OINTMENT | CUTANEOUS | Status: AC
  Administered 2018-06-03: 20:00:00
  Filled 2018-06-03: qty 4.5

## 2018-06-03 MED ORDER — HYDROMORPHONE HCL 1 MG/ML IJ SOLN
0.5000 mg | Freq: Once | INTRAMUSCULAR | Status: AC
  Administered 2018-06-03: 0.5 mg via INTRAVENOUS
  Filled 2018-06-03: qty 1

## 2018-06-03 MED ORDER — TETANUS-DIPHTH-ACELL PERTUSSIS 5-2.5-18.5 LF-MCG/0.5 IM SUSP
0.5000 mL | Freq: Once | INTRAMUSCULAR | Status: AC
  Administered 2018-06-03: 0.5 mL via INTRAMUSCULAR
  Filled 2018-06-03: qty 0.5

## 2018-06-03 NOTE — ED Provider Notes (Signed)
[facility] DEPT Provider Note   CSN: [id number] Arrival date & time: 06/03/18  1654     History   Chief Complaint Chief Complaint  Patient presents with  . Hand Injury    HPI Kevin Robbins is a 27 y.o. male with a h/o of von Willebrand who presents to the emergency department with a chief complaint of left hand injury around 2 PM.  The patient reports that he was plugging a tire with a drill when the that will went through his left hand.  The patient's wife is concerned because the drill was extremely dirty.  Bleeding is controlled at this time.  He reports severe pain that has worsened since onset and swelling to the palm of the left hand.  Pain radiates up the left arm.  He has been able to move his fingers, but it is painful.  He denies numbness, weakness, left wrist or elbow pain.  No treatment prior to arrival.  The patient is right-hand dominant.  No history of left hand surgeries or injuries.  The history is provided by the patient. No language interpreter was used.    History reviewed. No pertinent past medical history.  Patient Active Problem List   Diagnosis Date Noted  . Thrombocytopenia ([place]) 12/20/2014  . Bleeding 12/05/2014    History reviewed. No pertinent surgical history.      Home Medications    Prior to Admission medications   Medication Sig Start Date End Date Taking? Authorizing Provider  acetaminophen (TYLENOL) 325 MG tablet Take 650 mg by mouth every 6 (six) hours as needed.    [provider]  cyclobenzaprine (FLEXERIL) 10 MG tablet Take 1 tablet (10 mg total) by mouth 2 (two) times daily as needed for muscle spasms. 10/24/17   Ashley Murrain, NP  diclofenac (VOLTAREN) 50 MG EC tablet Take 1 tablet (50 mg total) by mouth 2 (two) times daily. 10/24/17   Ashley Murrain, NP  doxycycline (VIBRAMYCIN) 100 MG capsule Take 1 capsule (100 mg total) by mouth 2 (two) times daily. 06/03/18   McDonald, Mia A, PA-C    oxyCODONE-acetaminophen (PERCOCET/ROXICET) 5-325 MG tablet Take 1 tablet by mouth every 6 (six) hours as needed for severe pain. 06/03/18   McDonald, Mia A, PA-C    Family History No family history on file.  Social History Social History   Tobacco Use  . Smoking status: Never Smoker  . Smokeless tobacco: Never Used  Substance Use Topics  . Alcohol use: No  . Drug use: No     Allergies   Patient has no known allergies.   Review of Systems Review of Systems  Constitutional: Negative for appetite change, chills and fever.  Respiratory: Negative for shortness of breath.   Cardiovascular: Negative for chest pain.  Gastrointestinal: Negative for abdominal pain.  Genitourinary: Negative for dysuria.  Musculoskeletal: Positive for arthralgias and myalgias. Negative for back pain.  Skin: Positive for wound. Negative for color change, pallor and rash.  Allergic/Immunologic: Negative for immunocompromised state.  Neurological: Negative for weakness, numbness and headaches.  Psychiatric/Behavioral: Negative for confusion.   Physical Exam Updated Vital Signs BP 136/62 (BP Location: Right Arm)   Pulse 78   Temp 98 F (36.7 C) (Oral)   Resp 18   SpO2 100%   Physical Exam  Constitutional: He appears well-developed.  HENT:  Head: Normocephalic.  Eyes: Conjunctivae are normal.  Neck: Neck supple.  Cardiovascular: Normal rate, regular rhythm, normal heart sounds and intact distal  pulses. Exam reveals no gallop and no friction rub.  No murmur heard. Pulmonary/Chest: Effort normal. No stridor. No respiratory distress. He has no wheezes. He has no rales. He exhibits no tenderness.  Abdominal: Soft. He exhibits no distension.  Musculoskeletal:  There is a 0.5 mm entry wound to the dorsum of the left hand, located midway between the first MCP and the wrist.  There is a pinpoint exit wound located on the thenar eminence of the palm of the left hand.  Thenar eminence is moderately  swollen and significantly tender to palpation.  He is exquisitely tender to palpation on the dorsum of the left hand.  Radial pulses are 2+ and symmetric.  Decreased range of motion of the left thumb secondary to pain.  Good capillary refill of the left thumb.  No obvious foreign bodies or contamination.  The wound appears clean.  Sensation is intact to all 4 distal aspects of the left thumb.  No erythema, warmth, or red streaking is noted.  Neurological: He is alert.  Skin: Skin is warm and dry.  Psychiatric: His behavior is normal.  Nursing note and vitals reviewed.        ED Treatments / Results  Labs (all labs ordered are listed, but only abnormal results are displayed) Labs Reviewed  BASIC METABOLIC PANEL  CBC WITH DIFFERENTIAL/PLATELET  PROTIME-INR  APTT  TYPE AND SCREEN    EKG None  Radiology Dg Hand Complete Left  Result Date: 06/03/2018 CLINICAL DATA:  Drill bit stuck and hand.  Puncture wound in thumb. EXAM: LEFT HAND - COMPLETE 3+ VIEW COMPARISON:  None. FINDINGS: There is no evidence of fracture or dislocation. There is no evidence of arthropathy or other focal bone abnormality. Soft tissues are unremarkable. No radiopaque foreign bodies. IMPRESSION: Negative. Electronically Signed   By: Rolm Baptise M.D.   On: 06/03/2018 18:02    Procedures Procedures (including critical care time)  Medications Ordered in ED Medications  oxyCODONE-acetaminophen (PERCOCET/ROXICET) 5-325 MG per tablet 1 tablet (1 tablet Oral Given 06/03/18 1720)  Tdap (BOOSTRIX) injection 0.5 mL (0.5 mLs Intramuscular Given 06/03/18 1905)  HYDROmorphone (DILAUDID) injection 0.5 mg (0.5 mg Intravenous Given 06/03/18 1902)  ceFAZolin (ANCEF) IVPB 1 g/50 mL premix (1 g Intravenous New Bag/Given 06/03/18 1904)     Initial Impression / Assessment and Plan / ED Course  I have reviewed the triage vital signs and the nursing notes.  Pertinent labs & imaging results that were available during my care of  the patient were reviewed by me and considered in my medical decision making (see chart for details).     27 year old male with a history of von Willebrand's disease presenting with a stab wound of the left hand involving a drill.  Wound care provided in the ED.  Patient's Tdap was updated.  He was initially given Percocet for pain control with minimal improvement.  He reports significant improvement after Dilaudid.  The patient was seen and evaluated along with Dr. Gilford Raid, attending physician.    Spoke with Dr. Fredna Dow, hand surgery, who recommends IV antibiotics in the ED. He recommends discharging the patient to home with doxycycline and pain control.  He would like the patient to follow-up in his office in the next few days.  He states that surgery is not indicated unless the patient is showing signs of infection given the nature of the wound.  This plan was discussed with the patient and his wife were agreeable at this time.  Strict return precautions  given.  He is hemodynamically stable and in no acute distress.  He is safe for discharge home with outpatient follow-up.  Final Clinical Impressions(s) / ED Diagnoses   Final diagnoses:  Accident caused by drilling tool, initial encounter  Stab wound of left hand, initial encounter    ED Discharge Orders        Ordered    doxycycline (VIBRAMYCIN) 100 MG capsule  2 times daily     06/03/18 1944    oxyCODONE-acetaminophen (PERCOCET/ROXICET) 5-325 MG tablet  Every 6 hours PRN     06/03/18 1944       McDonald, Laymond Purser, PA-C 06/03/18 Laurell Roof, MD 06/03/18 2121

## 2018-06-03 NOTE — ED Triage Notes (Signed)
Pt reports he was plugging a tire with a drill when it jump and he accidentally drilled his L hand-drill went through his hand.  No active bleeding.  Pt is able to move his L thumb, swelling noted.  Cap refill <3 sec.

## 2018-06-03 NOTE — Discharge Instructions (Signed)
Thank you for allowing me to care for you today in the Emergency Department.   Please call Dr. Levell July office first thing tomorrow morning to schedule follow-up appointment.  He will most likely see you in the clinic on Monday or Tuesday.  It is important that you keep the wound clean and dry.  Clean your left hand with warm water and soap then apply a topical antibiotic such as bacitracin or Neosporin directly to the wound.  Keep both wounds covered with a bandage until you are seen by hand surgery.  You have been given a splint for protection.  Avoid submerging the wound underwater for long periods of time, particularly in a hot tub or pool, because this can cause an infection.  You can apply ice for 15 to 20 minutes up to 3-4 times a day to help with pain or swelling.  Take 600 mg of ibuprofen with food or 650 mg of Tylenol every 6 hours for pain control.  You can also alternate between these 2 medications every 3 hours.  For severe pain, you can take 1 tablet of Percocet every 6 hours.  Each tablet of Percocet has 325 mg of Tylenol.  Make sure that you do not take more than 4000 mg of Tylenol from all sources in a 24-hour.  Because of the nature of this wound, it is very important that you monitor your hand for signs or symptoms of infection.  You should follow-up with Dr. Levell July office or return to the emergency department if you develop a fever or chills, red streaking up the hand or arm, if the wounds get red, hot to the touch, if you have worsening swelling, or significant pain that you are unable to be control as these can all be signs of infection.

## 2018-06-04 LAB — ABO/RH: ABO/RH(D): O NEG

## 2018-06-08 DIAGNOSIS — S61432A Puncture wound without foreign body of left hand, initial encounter: Secondary | ICD-10-CM | POA: Diagnosis not present

## 2018-07-21 ENCOUNTER — Telehealth: Payer: Self-pay | Admitting: Internal Medicine

## 2018-07-21 NOTE — Telephone Encounter (Signed)
Dr Crawford,  °Would you be willing to see this patient to establish care?  °See message below. ° ° °

## 2018-07-21 NOTE — Telephone Encounter (Signed)
Copied from [place] [id number]. Topic: Appointment Scheduling - Scheduling Inquiry for Clinic >> Jul 21, 2018  4:32 PM Sheran Luz wrote: Reason for CRM: Pts wife is a current pt of Dr Sharlet [place] and would like to know if she could take on her husband as a new pt since Dr Sharlet [place] has a history with pts wife and her grandmother. Pts wife is named Building surveyor. Solmon Ice would like a call back on behalf of her husband at [phone].

## 2018-07-22 NOTE — Telephone Encounter (Signed)
Fine to schedule 

## 2018-07-22 NOTE — Telephone Encounter (Signed)
Contacted patient and informed. He asked that I call his wife to schedule. Called patient wife and left message for her to call back to schedule appointment.  PEC:  Okay to schedule OFFICE VISIT with VISIT NOTE: New patient - okay per Dr Sharlet [place].

## 2018-08-02 ENCOUNTER — Encounter: Payer: Self-pay | Admitting: Internal Medicine

## 2018-08-02 ENCOUNTER — Ambulatory Visit: Payer: BLUE CROSS/BLUE SHIELD | Admitting: Internal Medicine

## 2018-08-02 VITALS — BP 132/68 | HR 75 | Temp 98.4°F | Ht 72.0 in | Wt 263.0 lb

## 2018-08-02 DIAGNOSIS — D696 Thrombocytopenia, unspecified: Secondary | ICD-10-CM | POA: Diagnosis not present

## 2018-08-02 DIAGNOSIS — Z0000 Encounter for general adult medical examination without abnormal findings: Secondary | ICD-10-CM

## 2018-08-02 NOTE — Assessment & Plan Note (Signed)
Declines flu shot, tetanus up to date. Counseled about exercise and sun safety as well as mole surveillance. Given screening recommendations. Counseled about dangers of distracted driving.

## 2018-08-02 NOTE — Progress Notes (Signed)
   Subjective:    Patient ID: Kevin Robbins, male    DOB: Jun 18, 1991, 27 y.o.   MRN: [id number]  HPI The patient is a new 27 YO man coming in for physical.   PMH, [facility], social history reviewed and updated.   Review of Systems  Constitutional: Negative.   HENT: Negative.   Eyes: Negative.   Respiratory: Negative for cough, chest tightness and shortness of breath.   Cardiovascular: Negative for chest pain, palpitations and leg swelling.  Gastrointestinal: Negative for abdominal distention, abdominal pain, constipation, diarrhea, nausea and vomiting.  Musculoskeletal: Negative.   Skin: Negative.   Neurological: Negative.   Psychiatric/Behavioral: Negative.       Objective:   Physical Exam  Constitutional: He is oriented to person, place, and time. He appears well-developed and well-nourished.  HENT:  Head: Normocephalic and atraumatic.  Eyes: EOM are normal.  Neck: Normal range of motion.  Cardiovascular: Normal rate and regular rhythm.  Pulmonary/Chest: Effort normal and breath sounds normal. No respiratory distress. He has no wheezes. He has no rales.  Abdominal: Soft. Bowel sounds are normal. He exhibits no distension. There is no tenderness. There is no rebound.  Musculoskeletal: He exhibits no edema.  Neurological: He is alert and oriented to person, place, and time. Coordination normal.  Skin: Skin is warm and dry.  Psychiatric: He has a normal mood and affect.   Vitals:   08/02/18 0908  BP: 132/68  Pulse: 75  Temp: 98.4 F (36.9 C)  TempSrc: Oral  SpO2: 98%  Weight: 263 lb (119.3 kg)  Height: 6' (1.829 m)      Assessment & Plan:

## 2018-08-02 NOTE — Assessment & Plan Note (Signed)
Evaluation with oncology negative and may be variant of von willebrand. He states he had this as a child but bleeding disorder is better now than before. Recent platelets almost normal.

## 2018-08-02 NOTE — Patient Instructions (Addendum)
[facility] [phone]  Health Maintenance, Male A healthy lifestyle and preventive care is important for your health and wellness. Ask your health care provider about what schedule of regular examinations is right for you. What should I know about weight and diet? Eat a Healthy Diet  Eat plenty of vegetables, fruits, whole grains, low-fat dairy products, and lean protein.  Do not eat a lot of foods high in solid fats, added sugars, or salt.  Maintain a Healthy Weight Regular exercise can help you achieve or maintain a healthy weight. You should:  Do at least 150 minutes of exercise each week. The exercise should increase your heart rate and make you sweat (moderate-intensity exercise).  Do strength-training exercises at least twice a week.  Watch Your Levels of Cholesterol and Blood Lipids  Have your blood tested for lipids and cholesterol every 5 years starting at 29 years of age. If you are at high risk for heart disease, you should start having your blood tested when you are 29 years old. You may need to have your cholesterol levels checked more often if: ? Your lipid or cholesterol levels are high. ? You are older than 29 years of age. ? You are at high risk for heart disease.  What should I know about cancer screening? Many types of cancers can be detected early and may often be prevented. Lung Cancer  You should be screened every year for lung cancer if: ? You are a current smoker who has smoked for at least 30 years. ? You are a former smoker who has quit within the past 15 years.  Talk to your health care provider about your screening options, when you should start screening, and how often you should be screened.  Colorectal Cancer  Routine colorectal cancer screening usually begins at 29 years of age and should be repeated every 5-10 years until you are 29 years old. You may need to be screened more often if early forms of precancerous polyps or  small growths are found. Your health care provider may recommend screening at an earlier age if you have risk factors for colon cancer.  Your health care provider may recommend using home test kits to check for hidden blood in the stool.  A small camera at the end of a tube can be used to examine your colon (sigmoidoscopy or colonoscopy). This checks for the earliest forms of colorectal cancer.  Prostate and Testicular Cancer  Depending on your age and overall health, your health care provider may do certain tests to screen for prostate and testicular cancer.  Talk to your health care provider about any symptoms or concerns you have about testicular or prostate cancer.  Skin Cancer  Check your skin from head to toe regularly.  Tell your health care provider about any new moles or changes in moles, especially if: ? There is a change in a mole's size, shape, or color. ? You have a mole that is larger than a pencil eraser.  Always use sunscreen. Apply sunscreen liberally and repeat throughout the day.  Protect yourself by wearing long sleeves, pants, a wide-brimmed hat, and sunglasses when outside.  What should I know about heart disease, diabetes, and high blood pressure?  If you are 34-42 years of age, have your blood pressure checked every 3-5 years. If you are 27 years of age or older, have your blood pressure checked every year. You should have your blood pressure measured twice-once when you are at a hospital  or clinic, and once when you are not at a hospital or clinic. Record the average of the two measurements. To check your blood pressure when you are not at a hospital or clinic, you can use: ? An automated blood pressure machine at a pharmacy. ? A home blood pressure monitor.  Talk to your health care provider about your target blood pressure.  If you are between 62-48 years old, ask your health care provider if you should take aspirin to prevent heart disease.  Have regular  diabetes screenings by checking your fasting blood sugar level. ? If you are at a normal weight and have a low risk for diabetes, have this test once every three years after the age of 31. ? If you are overweight and have a high risk for diabetes, consider being tested at a younger age or more often.  A one-time screening for abdominal aortic aneurysm (AAA) by ultrasound is recommended for men aged 35-75 years who are current or former smokers. What should I know about preventing infection? Hepatitis B If you have a higher risk for hepatitis B, you should be screened for this virus. Talk with your health care provider to find out if you are at risk for hepatitis B infection. Hepatitis C Blood testing is recommended for:  Everyone born from [birth year] through 1967.  Anyone with known risk factors for hepatitis C.  Sexually Transmitted Diseases (STDs)  You should be screened each year for STDs including gonorrhea and chlamydia if: ? You are sexually active and are younger than 29 years of age. ? You are older than 29 years of age and your health care provider tells you that you are at risk for this type of infection. ? Your sexual activity has changed since you were last screened and you are at an increased risk for chlamydia or gonorrhea. Ask your health care provider if you are at risk.  Talk with your health care provider about whether you are at high risk of being infected with HIV. Your health care provider may recommend a prescription medicine to help prevent HIV infection.  What else can I do?  Schedule regular health, dental, and eye exams.  Stay current with your vaccines (immunizations).  Do not use any tobacco products, such as cigarettes, chewing tobacco, and e-cigarettes. If you need help quitting, ask your health care provider.  Limit alcohol intake to no more than 2 drinks per day. One drink equals 12 ounces of beer, 5 ounces of wine, or 1 ounces of hard liquor.  Do not use  street drugs.  Do not share needles.  Ask your health care provider for help if you need support or information about quitting drugs.  Tell your health care provider if you often feel depressed.  Tell your health care provider if you have ever been abused or do not feel safe at home. This information is not intended to replace advice given to you by your health care provider. Make sure you discuss any questions you have with your health care provider. Document Released: 05/01/2008 Document Revised: 07/02/2016 Document Reviewed: 08/07/2015 Elsevier Interactive Patient Education  [employer].

## 2018-08-18 ENCOUNTER — Telehealth: Payer: Self-pay | Admitting: Internal Medicine

## 2018-08-18 NOTE — Telephone Encounter (Signed)
Copied from [place] [id number]. Topic: Referral - Status >> Aug 18, 2018  4:52 PM Rutherford Nail, NT wrote: Reason for CRM: Patient's wife calling to check the status of the [place] [facility] referral being placed. States that they have not heard from anyone and were unsure if they needed to reach out to them and get something scheduled. Upon looking in referral tab, there was not a referral placed. Please advise.

## 2018-08-19 NOTE — Telephone Encounter (Signed)
Left detailed msg on pt's wife vm

## 2018-08-25 NOTE — Telephone Encounter (Signed)
Can you please put a referral in for the [facility]. Pt's wife called and stated pt was told he needed one and I called and was told he didn't.  She is wanting [place] to go ahead and send one to be on the safe side.

## 2018-08-26 NOTE — Telephone Encounter (Signed)
Left detailed msg on wife's vm

## 2018-08-26 NOTE — Telephone Encounter (Signed)
I have given them the number so all they have to do is call [place] [facility] directly themselves to get an appointment.

## 2018-10-05 DIAGNOSIS — Z6834 Body mass index (BMI) 34.0-34.9, adult: Secondary | ICD-10-CM | POA: Diagnosis not present

## 2018-10-05 DIAGNOSIS — R030 Elevated blood-pressure reading, without diagnosis of hypertension: Secondary | ICD-10-CM | POA: Diagnosis not present

## 2018-10-05 DIAGNOSIS — M546 Pain in thoracic spine: Secondary | ICD-10-CM | POA: Diagnosis not present

## 2018-10-05 DIAGNOSIS — G8929 Other chronic pain: Secondary | ICD-10-CM | POA: Diagnosis not present

## 2018-10-07 ENCOUNTER — Encounter: Payer: Self-pay | Admitting: Internal Medicine

## 2018-10-07 ENCOUNTER — Other Ambulatory Visit: Payer: BLUE CROSS/BLUE SHIELD

## 2018-10-07 ENCOUNTER — Ambulatory Visit: Payer: BLUE CROSS/BLUE SHIELD | Admitting: Internal Medicine

## 2018-10-07 VITALS — BP 122/70 | HR 74 | Temp 98.0°F | Ht 72.0 in | Wt 263.0 lb

## 2018-10-07 DIAGNOSIS — Z205 Contact with and (suspected) exposure to viral hepatitis: Secondary | ICD-10-CM

## 2018-10-07 NOTE — Progress Notes (Signed)
   Subjective:    Patient ID: Destan Franchini, male    DOB: February 08, 1991, 27 y.o.   MRN: [id number]  HPI The patient is a 27 YO man coming in for concerns about exposure to hepatitis B. He has relative recently diagnosed with this that cooks a lot of things for them. Denies any symptoms such as fatigue, fevers, abdominal pain, diarrhea recently. Denies new sexual partner or IV drug usage.   Review of Systems  Constitutional: Negative.   HENT: Negative.   Eyes: Negative.   Respiratory: Negative for cough, chest tightness and shortness of breath.   Cardiovascular: Negative for chest pain, palpitations and leg swelling.  Gastrointestinal: Negative for abdominal distention, abdominal pain, constipation, diarrhea, nausea and vomiting.  Musculoskeletal: Negative.   Skin: Negative.   Neurological: Negative.   Psychiatric/Behavioral: Negative.       Objective:   Physical Exam  Constitutional: He is oriented to person, place, and time. He appears well-developed and well-nourished.  HENT:  Head: Normocephalic and atraumatic.  Eyes: EOM are normal.  Neck: Normal range of motion.  Cardiovascular: Normal rate and regular rhythm.  Pulmonary/Chest: Effort normal and breath sounds normal. No respiratory distress. He has no wheezes. He has no rales.  Abdominal: Soft. Bowel sounds are normal. He exhibits no distension. There is no tenderness. There is no rebound.  Musculoskeletal: He exhibits no edema.  Neurological: He is alert and oriented to person, place, and time. Coordination normal.  Skin: Skin is warm and dry.  Psychiatric: He has a normal mood and affect.   Vitals:   10/07/18 0845  BP: 122/70  Pulse: 74  Temp: 98 F (36.7 C)  TempSrc: Oral  SpO2: 99%  Weight: 263 lb (119.3 kg)  Height: 6' (1.829 m)      Assessment & Plan:

## 2018-10-07 NOTE — Assessment & Plan Note (Signed)
Checking for infection and immunity and treat as appropriate.

## 2018-10-08 LAB — HEPATITIS B SURFACE ANTIGEN: HEP B S AG: NONREACTIVE

## 2018-10-08 LAB — HEPATITIS B SURFACE ANTIBODY,QUALITATIVE: Hep B S Ab: NONREACTIVE

## 2018-10-12 ENCOUNTER — Ambulatory Visit: Payer: BLUE CROSS/BLUE SHIELD

## 2018-10-12 ENCOUNTER — Ambulatory Visit (INDEPENDENT_AMBULATORY_CARE_PROVIDER_SITE_OTHER): Payer: BLUE CROSS/BLUE SHIELD | Admitting: *Deleted

## 2018-10-12 DIAGNOSIS — Z23 Encounter for immunization: Secondary | ICD-10-CM

## 2018-10-18 DIAGNOSIS — R41840 Attention and concentration deficit: Secondary | ICD-10-CM | POA: Diagnosis not present

## 2018-10-18 DIAGNOSIS — Z79899 Other long term (current) drug therapy: Secondary | ICD-10-CM | POA: Diagnosis not present

## 2018-10-18 DIAGNOSIS — F4010 Social phobia, unspecified: Secondary | ICD-10-CM | POA: Diagnosis not present

## 2018-10-19 DIAGNOSIS — M546 Pain in thoracic spine: Secondary | ICD-10-CM | POA: Diagnosis not present

## 2018-10-22 ENCOUNTER — Ambulatory Visit: Payer: BLUE CROSS/BLUE SHIELD | Admitting: Internal Medicine

## 2018-10-22 ENCOUNTER — Encounter: Payer: Self-pay | Admitting: Internal Medicine

## 2018-10-22 DIAGNOSIS — M549 Dorsalgia, unspecified: Secondary | ICD-10-CM | POA: Insufficient documentation

## 2018-10-22 DIAGNOSIS — M546 Pain in thoracic spine: Secondary | ICD-10-CM

## 2018-10-22 MED ORDER — TRAMADOL HCL 50 MG PO TABS: 50.0000 mg | ORAL_TABLET | Freq: Three times a day (TID) | ORAL | 0 refills | Status: DC | PRN

## 2018-10-22 MED ORDER — METHYLPREDNISOLONE ACETATE 80 MG/ML IJ SUSP
80.0000 mg | Freq: Once | INTRAMUSCULAR | Status: AC
  Administered 2018-10-22: 80 mg via INTRAMUSCULAR

## 2018-10-22 NOTE — Assessment & Plan Note (Signed)
Depo-medrol 80 mg IM given at visit. Encouraged to keep using ibuprofen and tylenol. Rx for tramadol small amount to help with pain. Seeing neurosurgery and had MRI for more permanent intervention. Advised to be safe at job to avoid more falls.

## 2018-10-22 NOTE — Patient Instructions (Addendum)
We have sent in a prescription for tramadol to help with pain and have given you a steroid shot today.

## 2018-10-22 NOTE — Progress Notes (Signed)
   Subjective:    Patient ID: Kevin Robbins, male    DOB: 11/14/1991, 27 y.o.   MRN: [id number]  HPI The patient is a 27 YO man coming in for back pain. Started about 6 months ago with pain while lifting or doing certain activities. Then took a prednisone pack he had leftover from a doctor about 2 weeks ago which helped. He then had a fall from 2 stories up at work within the last week. This flared the pain up immensely. He has had MRI at neurosurgeon office yesterday but does not have any results. Will be seeing them in 1-2 weeks for intervention. Has tried ibuprofen and tylenol which were helping when pain was more moderate but now is not helping at all. Mostly located in the middle of back and sometimes goes down into his arms. Wakes up with numbness in both hands. Overall pain is 8/10 when back and 3-4/10 all the time.   Review of Systems  Constitutional: Positive for activity change. Negative for appetite change, fatigue, fever and unexpected weight change.  Respiratory: Negative.   Cardiovascular: Negative.   Musculoskeletal: Positive for back pain and myalgias. Negative for arthralgias.  Skin: Negative.   Neurological: Negative for syncope, weakness and numbness.      Objective:   Physical Exam  Constitutional: He is oriented to person, place, and time. He appears well-developed and well-nourished.  HENT:  Head: Normocephalic and atraumatic.  Eyes: EOM are normal.  Neck: Normal range of motion.  Cardiovascular: Normal rate and regular rhythm.  Pulmonary/Chest: Effort normal and breath sounds normal. No respiratory distress. He has no wheezes. He has no rales.  Musculoskeletal: He exhibits tenderness. He exhibits no edema.  Pain in the thoracic region right more than left, midline and right scapular region  Neurological: He is alert and oriented to person, place, and time. Coordination normal.  Skin: Skin is warm and dry.  Psychiatric: He has a normal mood and affect.   Vitals:   10/22/18 1057  BP: 120/80  Pulse: 90  SpO2: 99%  Weight: 267 lb (121.1 kg)  Height: 6' (1.829 m)      Assessment & Plan:  Depo-medrol 80 mg IM

## 2018-10-26 DIAGNOSIS — M546 Pain in thoracic spine: Secondary | ICD-10-CM | POA: Diagnosis not present

## 2018-11-11 ENCOUNTER — Ambulatory Visit: Payer: BLUE CROSS/BLUE SHIELD

## 2018-11-12 ENCOUNTER — Ambulatory Visit: Payer: BLUE CROSS/BLUE SHIELD | Admitting: Internal Medicine

## 2018-11-12 ENCOUNTER — Ambulatory Visit: Payer: BLUE CROSS/BLUE SHIELD

## 2018-11-15 ENCOUNTER — Encounter: Payer: Self-pay | Admitting: Internal Medicine

## 2018-11-15 ENCOUNTER — Ambulatory Visit: Payer: BLUE CROSS/BLUE SHIELD | Admitting: Internal Medicine

## 2018-11-15 VITALS — BP 130/80 | HR 76 | Temp 98.2°F | Ht 72.0 in | Wt 263.0 lb

## 2018-11-15 DIAGNOSIS — Z23 Encounter for immunization: Secondary | ICD-10-CM

## 2018-11-15 DIAGNOSIS — M546 Pain in thoracic spine: Secondary | ICD-10-CM | POA: Diagnosis not present

## 2018-11-15 MED ORDER — TRAMADOL HCL 50 MG PO TABS: 50.0000 mg | ORAL_TABLET | Freq: Three times a day (TID) | ORAL | 0 refills | Status: DC | PRN

## 2018-11-15 MED ORDER — METHYLPREDNISOLONE ACETATE 40 MG/ML IJ SUSP
40.0000 mg | Freq: Once | INTRAMUSCULAR | Status: AC
Start: 2018-11-15 — End: 2018-11-15
  Administered 2018-11-15: 40 mg via INTRAMUSCULAR

## 2018-11-15 NOTE — Progress Notes (Signed)
   Subjective:   Patient ID: Kevin Robbins, male    DOB: 07-12-1991, 27 y.o.   MRN: [id number]  HPI The patient is a 27 YO man coming in for back pain ongoing. He has had MRI with back specialist and met with them and they did not offer him many options. They did offer him steroid injections but advised against that as it can cause some bone degeneration and he has the start of that in his low back. He is in pain most days and it is hard to get going in the morning. He got steroid injection with [place] about 1 month ago which lasted about 1 week or so. Has been taking ibuprofen over the counter and rare tramadol but is out. Would like refill if possible. He is not able to take muscle relaxers due to side effects.   Review of Systems  Constitutional: Positive for activity change. Negative for appetite change, fatigue, fever and unexpected weight change.  Respiratory: Negative.   Cardiovascular: Negative.   Musculoskeletal: Positive for back pain and myalgias. Negative for arthralgias.  Skin: Negative.   Neurological: Negative for syncope, weakness and numbness.    Objective:  Physical Exam Constitutional:      Appearance: He is well-developed.  HENT:     Head: Normocephalic and atraumatic.  Neck:     Musculoskeletal: Normal range of motion.  Cardiovascular:     Rate and Rhythm: Normal rate and regular rhythm.  Pulmonary:     Effort: Pulmonary effort is normal. No respiratory distress.     Breath sounds: Normal breath sounds. No wheezing or rales.  Abdominal:     General: There is no distension.     Palpations: Abdomen is soft.     Tenderness: There is no abdominal tenderness. There is no rebound.  Musculoskeletal:        General: Tenderness present.  Skin:    General: Skin is warm and dry.  Neurological:     Mental Status: He is alert and oriented to person, place, and time.     Coordination: Coordination normal.     Vitals:   11/15/18 1512  BP: 130/80  Pulse: 76  Temp: 98.2 F  (36.8 C)  TempSrc: Oral  SpO2: 98%  Weight: 263 lb (119.3 kg)  Height: 6' (1.829 m)    Assessment & Plan:  Depo-medrol 40 mg IM Hep B vaccine given at visit

## 2018-11-15 NOTE — Patient Instructions (Signed)
We have sent in refill of the tramadol and will get you in with a second opinion neurosurgeon. If you can request a disc of the MRI from the place that did it and take this to the visit it will help speed up the process.

## 2018-11-16 NOTE — Assessment & Plan Note (Signed)
Referral to neurosurgeon for second opinion. Talked with him about nerve block or epidural injection which would be safer long term. Rx for small amount tramadol to help him be more functional but we talked again how this is not a very good medicine for a long term solution and we should focus on getting him more functional as he is very young.

## 2018-11-23 DIAGNOSIS — M5414 Radiculopathy, thoracic region: Secondary | ICD-10-CM | POA: Diagnosis not present

## 2018-11-23 DIAGNOSIS — M545 Low back pain: Secondary | ICD-10-CM | POA: Diagnosis not present

## 2018-11-23 DIAGNOSIS — M9903 Segmental and somatic dysfunction of lumbar region: Secondary | ICD-10-CM | POA: Diagnosis not present

## 2018-11-23 DIAGNOSIS — M9902 Segmental and somatic dysfunction of thoracic region: Secondary | ICD-10-CM | POA: Diagnosis not present

## 2018-11-24 DIAGNOSIS — Z79899 Other long term (current) drug therapy: Secondary | ICD-10-CM | POA: Diagnosis not present

## 2018-11-24 DIAGNOSIS — M5414 Radiculopathy, thoracic region: Secondary | ICD-10-CM | POA: Diagnosis not present

## 2018-11-24 DIAGNOSIS — M9903 Segmental and somatic dysfunction of lumbar region: Secondary | ICD-10-CM | POA: Diagnosis not present

## 2018-11-24 DIAGNOSIS — M545 Low back pain: Secondary | ICD-10-CM | POA: Diagnosis not present

## 2018-11-24 DIAGNOSIS — F902 Attention-deficit hyperactivity disorder, combined type: Secondary | ICD-10-CM | POA: Diagnosis not present

## 2018-11-24 DIAGNOSIS — F4010 Social phobia, unspecified: Secondary | ICD-10-CM | POA: Diagnosis not present

## 2018-11-24 DIAGNOSIS — M9902 Segmental and somatic dysfunction of thoracic region: Secondary | ICD-10-CM | POA: Diagnosis not present

## 2018-12-01 DIAGNOSIS — M546 Pain in thoracic spine: Secondary | ICD-10-CM | POA: Diagnosis not present

## 2018-12-07 DIAGNOSIS — M47814 Spondylosis without myelopathy or radiculopathy, thoracic region: Secondary | ICD-10-CM | POA: Diagnosis not present

## 2018-12-07 DIAGNOSIS — M545 Low back pain: Secondary | ICD-10-CM | POA: Diagnosis not present

## 2019-01-24 ENCOUNTER — Ambulatory Visit (INDEPENDENT_AMBULATORY_CARE_PROVIDER_SITE_OTHER): Payer: BLUE CROSS/BLUE SHIELD | Admitting: Nurse Practitioner

## 2019-01-24 ENCOUNTER — Encounter: Payer: Self-pay | Admitting: Nurse Practitioner

## 2019-01-24 VITALS — BP 112/82 | HR 96 | Ht 72.0 in | Wt 258.0 lb

## 2019-01-24 DIAGNOSIS — M546 Pain in thoracic spine: Secondary | ICD-10-CM

## 2019-01-24 MED ORDER — METHYLPREDNISOLONE ACETATE 40 MG/ML IJ SUSP
40.0000 mg | Freq: Once | INTRAMUSCULAR | Status: AC
  Administered 2019-01-24: 40 mg via INTRAMUSCULAR

## 2019-01-24 NOTE — Progress Notes (Signed)
  Kevin Robbins is a 28 y.o. male with the following history as recorded in EpicCare:  Patient Active Problem List   Diagnosis Date Noted  . Acute back pain 10/22/2018  . Routine general medical examination at a health care facility 08/02/2018  . Thrombocytopenia ([place]) 12/20/2014    Current Outpatient Medications  Medication Sig Dispense Refill  . acetaminophen (TYLENOL) 325 MG tablet Take 650 mg by mouth every 6 (six) hours as needed.    . traMADol (ULTRAM) 50 MG tablet Take 1 tablet (50 mg total) by mouth every 8 (eight) hours as needed. 30 tablet 0  . VYVANSE 20 MG capsule TK 1 C PO D..  0   No current facility-administered medications for this visit.     Allergies: Patient has no known allergies.  Past Medical History:  Diagnosis Date  . Von Willebrand disease ([place])     Past Surgical History:  Procedure Laterality Date  . KNEE SURGERY Left 2017  . KNEE SURGERY Right 2018  . lipoma removal  N/A 2018    Family History  Problem Relation Age of Onset  . Drug abuse Other   . Von Willebrand disease Other     Social History   Tobacco Use  . Smoking status: Never Smoker  . Smokeless tobacco: Never Used  Substance Use Topics  . 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 He describes his pain as constant daily aching pain in mid to lower back. He is now following with Dr Ronnald Ramp, Nuerosurgery, for his back pain, plans to continue. He denies fevers, chills, weakness, falls, bowel or bladder changes. Tried exercising, foam  roller at home which helps some  ROS- See HPI   Objective:  Vitals:   01/24/19 1357  BP: 112/82  Pulse: 96  SpO2: 99%  Weight: 258 lb (117 kg)  Height: 6' (1.829 m)    General: Well developed, well nourished, in no acute distress  Skin : Warm and dry.  Head: Normocephalic and atraumatic  Eyes: Sclera and conjunctiva clear; pupils round and reactive to light; extraocular movements intact  Oropharynx: Pink, supple. No suspicious lesions  Neck: Supple Lungs: Respirations unlabored; clear to auscultation bilaterally without wheeze, rales, rhonchi  CVS exam: normal rate and regular rhythm.  Musculoskeletal: No deformities; no active joint inflammation; normal ROM  Extremities: No edema, cyanosis, clubbing  Vessels: Symmetric bilaterally  Neurologic: Alert and oriented; speech intact; face symmetrical; moves all extremities well; CNII-XII intact without focal deficit  Psychiatric: Normal mood and affect.  Assessment:  1. Acute bilateral thoracic back pain     Plan:   Discussed case with his PCP dr Sharlet Salina Depo injection given today Home management, red flags and return precautions including when to seek immediate care discussed and printed on AVS He will continue f/u with neurosurgery as planned He will f/u sooner for new, worsening symptoms - methylPREDNISolone acetate (DEPO-MEDROL) injection 40 mg  No follow-ups on file.

## 2019-01-24 NOTE — Patient Instructions (Signed)

## 2019-02-15 DIAGNOSIS — M546 Pain in thoracic spine: Secondary | ICD-10-CM | POA: Diagnosis not present

## 2019-04-20 ENCOUNTER — Telehealth: Payer: Self-pay | Admitting: Internal Medicine

## 2019-04-20 NOTE — Telephone Encounter (Signed)
Copied from [place] [id number]. Topic: Quick Communication - Rx Refill/Question >> Apr 20, 2019  3:35 PM Waylan Rocher, Lumin L wrote: Medication: traMADol (ULTRAM) 50 MG tablet, flexeril 5mg   Has the patient contacted their pharmacy? yes (Agent: If no, request that the patient contact the pharmacy for the refill.) (Agent: If yes, when and what did the pharmacy advise?)  Preferred Pharmacy (with phone number or street name):[id number] - [place], [address] AT [place] [address] Phone: [phone] Fax: [phone]  Agent: Please be advised that RX refills may take up to 3 business days. We ask that you follow-up with your pharmacy.

## 2019-04-21 NOTE — Telephone Encounter (Signed)
Control database checked last refill: 04/06/2019 by Johny Sax 15 day supply  LOV 01/24/2019 with ashleigh  NOV: none   Do not see flexiril on list

## 2019-04-21 NOTE — Telephone Encounter (Signed)
He cannot have more than 1 prescriber of tramadol. He has been getting this regularly from this [place] and he should contact that office for refills. If they cannot he needs virtual visit.

## 2019-04-21 NOTE — Telephone Encounter (Signed)
Patient will be calling back to set up a doxy visit to discuss with Dr. Sharlet Salina

## 2019-04-22 ENCOUNTER — Encounter: Payer: Self-pay | Admitting: Internal Medicine

## 2019-04-22 ENCOUNTER — Ambulatory Visit (INDEPENDENT_AMBULATORY_CARE_PROVIDER_SITE_OTHER): Payer: BC Managed Care – PPO | Admitting: Internal Medicine

## 2019-04-22 DIAGNOSIS — M546 Pain in thoracic spine: Secondary | ICD-10-CM

## 2019-04-22 MED ORDER — TRAMADOL HCL 50 MG PO TABS: 50.0000 mg | ORAL_TABLET | Freq: Three times a day (TID) | ORAL | 0 refills | Status: DC | PRN

## 2019-04-22 NOTE — Telephone Encounter (Signed)
Patient returned a call to [facility] Ph# [phone]

## 2019-04-22 NOTE — Telephone Encounter (Signed)
Left message for patient to call back  

## 2019-04-22 NOTE — Progress Notes (Signed)
Virtual Visit via Video Note  I connected with Kevin Robbins on 04/22/19 at  3:20 PM EDT by a video enabled telemedicine application and verified that I am speaking with the correct person using two identifiers.  The patient and the provider were at separate locations throughout the entire encounter.   I discussed the limitations of evaluation and management by telemedicine and the availability of in person appointments. The patient expressed understanding and agreed to proceed.  History of Present Illness: The patient is a 28 y.o. man with visit for chronic back pain. Started about a year or so ago. Has been seeing neurosurgery and is going to pain management in about 2 weeks. Needs refill of tramadol until that time. 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 Has tried tramadol which helps some. He has had bad reaction to muscle relaxers in the past and cannot try any of those. Takes also NSAIDs and tylenol otc without any relief of pain. He did get #30 tramadol per month from [place] previously and then stopped getting it from [place] and starting getting it at #30 per month from neurosurgery and then eventually with increase to #60 which he has been filling every 2 weeks since that time.   Observations/Objective: Appearance: normal, breathing appears normal, casual grooming, abdomen does not appear distended, throat normal, mental status is A and O times 3  Assessment and Plan: See problem oriented charting  Follow Up Instructions: rx tramadol #60 no refills, keep visit with pain management  I discussed the assessment and treatment plan with the patient. The patient was provided an opportunity to ask questions and all were answered. The patient agreed with the plan and demonstrated an understanding of the instructions.   The patient was advised to call back or seek an in-person evaluation if the symptoms worsen or if the  condition fails to improve as anticipated.  Hoyt Koch, MD

## 2019-04-22 NOTE — Assessment & Plan Note (Signed)
Refill tramadol #60 no refills. He will keep visit with pain management for further management of his long term pain.

## 2019-05-05 DIAGNOSIS — Z79899 Other long term (current) drug therapy: Secondary | ICD-10-CM | POA: Diagnosis not present

## 2019-05-05 DIAGNOSIS — M7918 Myalgia, other site: Secondary | ICD-10-CM | POA: Diagnosis not present

## 2019-05-05 DIAGNOSIS — M546 Pain in thoracic spine: Secondary | ICD-10-CM | POA: Diagnosis not present

## 2019-05-05 DIAGNOSIS — R030 Elevated blood-pressure reading, without diagnosis of hypertension: Secondary | ICD-10-CM | POA: Diagnosis not present

## 2019-06-20 ENCOUNTER — Encounter: Payer: Self-pay | Admitting: Internal Medicine

## 2019-06-20 ENCOUNTER — Ambulatory Visit (INDEPENDENT_AMBULATORY_CARE_PROVIDER_SITE_OTHER): Payer: BC Managed Care – PPO | Admitting: Internal Medicine

## 2019-06-20 DIAGNOSIS — M542 Cervicalgia: Secondary | ICD-10-CM

## 2019-06-20 DIAGNOSIS — M546 Pain in thoracic spine: Secondary | ICD-10-CM | POA: Diagnosis not present

## 2019-06-20 MED ORDER — KETOROLAC TROMETHAMINE 10 MG PO TABS: 10.0000 mg | ORAL_TABLET | Freq: Four times a day (QID) | ORAL | 0 refills | Status: DC | PRN

## 2019-06-20 NOTE — Assessment & Plan Note (Signed)
Recent x-ray after accident so no fracture likely. Rx for toradol to use for pain. Can still use tramadol prescribed by other provider and tylenol and muscle relaxer and heating pad. If no improvement could need MRI. Advised of typical course of 4-6 weeks.
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
## 2019-06-21 ENCOUNTER — Telehealth: Payer: Self-pay | Admitting: Internal Medicine

## 2019-06-21 DIAGNOSIS — M542 Cervicalgia: Secondary | ICD-10-CM

## 2019-06-21 DIAGNOSIS — M549 Dorsalgia, unspecified: Secondary | ICD-10-CM

## 2019-06-21 NOTE — Telephone Encounter (Signed)
LVM informing patient of MD response  

## 2019-06-21 NOTE — Telephone Encounter (Signed)
Refill for traMADol (ULTRAM) 50 MG tablet  Pharmacy:  [id number] [address], [address] AT [facility] [address] [phone] (Phone) [phone] (Fax

## 2019-06-21 NOTE — Telephone Encounter (Signed)
He already has a Publishing rights manager, this is a back/neck specialist he can just call his.
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
## 2019-06-21 NOTE — Telephone Encounter (Signed)
Called patient and informed that he should not need a referral for a chiropractor. He said that he would do some research on a good one to go to.  He also asked if Dr Sharlet Salina could refer him to a neck and back specialist? He said that he can not move his neck.  I was going to offer sports medicine with Dr Tamala Julian but his first available is 4 weeks away.  Please advise.

## 2019-06-21 NOTE — Telephone Encounter (Signed)
This was refilled by another provider on 06/16/2019 for 60 tabs will not be refilled by Dr. Sharlet Salina

## 2019-06-22 NOTE — Telephone Encounter (Signed)
Pts wife called to give information regarding the doctor that pt would like to see for his second opinion. Please advise.   Rebecka Apley Fax # [phone]

## 2019-06-23 NOTE — Telephone Encounter (Signed)
Referral ordered and faxed to number given below

## 2019-06-23 NOTE — Telephone Encounter (Signed)
Okay to place referral for this for neck pain

## 2019-07-04 DIAGNOSIS — M542 Cervicalgia: Secondary | ICD-10-CM | POA: Diagnosis not present

## 2019-07-08 DIAGNOSIS — M546 Pain in thoracic spine: Secondary | ICD-10-CM | POA: Diagnosis not present

## 2019-07-08 DIAGNOSIS — Z79899 Other long term (current) drug therapy: Secondary | ICD-10-CM | POA: Diagnosis not present

## 2019-07-08 DIAGNOSIS — F902 Attention-deficit hyperactivity disorder, combined type: Secondary | ICD-10-CM | POA: Diagnosis not present

## 2019-07-18 ENCOUNTER — Ambulatory Visit (INDEPENDENT_AMBULATORY_CARE_PROVIDER_SITE_OTHER): Payer: BC Managed Care – PPO | Admitting: Family Medicine

## 2019-07-18 ENCOUNTER — Encounter: Payer: Self-pay | Admitting: Family Medicine

## 2019-07-18 DIAGNOSIS — M542 Cervicalgia: Secondary | ICD-10-CM | POA: Diagnosis not present

## 2019-07-18 DIAGNOSIS — M546 Pain in thoracic spine: Secondary | ICD-10-CM | POA: Diagnosis not present

## 2019-07-18 MED ORDER — TRAMADOL HCL 50 MG PO TABS: 50.0000 mg | ORAL_TABLET | Freq: Three times a day (TID) | ORAL | 0 refills | Status: DC | PRN

## 2019-07-18 MED ORDER — CELECOXIB 200 MG PO CAPS: 200.0000 mg | ORAL_CAPSULE | Freq: Two times a day (BID) | ORAL | 3 refills | Status: DC | PRN
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
## 2019-08-04 ENCOUNTER — Telehealth: Payer: Self-pay | Admitting: Family Medicine

## 2019-08-04 ENCOUNTER — Other Ambulatory Visit: Payer: Self-pay

## 2019-08-04 DIAGNOSIS — Z0000 Encounter for general adult medical examination without abnormal findings: Secondary | ICD-10-CM

## 2019-08-04 NOTE — Telephone Encounter (Signed)
Patient called. He would like to know if he can get his Tramadol refilled. His call back number is [phone]

## 2019-08-04 NOTE — Progress Notes (Signed)
Fine to sign. Can associate with z00.00

## 2019-08-05 MED ORDER — TRAMADOL HCL 50 MG PO TABS: 50.0000 mg | ORAL_TABLET | Freq: Three times a day (TID) | ORAL | 0 refills | Status: DC | PRN

## 2019-08-05 NOTE — Telephone Encounter (Signed)
I tried calling the patient back - voice mail was full, so I could not leave a message. Will try back later.

## 2019-08-05 NOTE — Telephone Encounter (Signed)
I called and advised the patient the Tramadol was sent in to his pharmacy.

## 2019-08-05 NOTE — Addendum Note (Signed)
Addended by: Hortencia Pilar on: 08/05/2019 08:11 AM   Modules accepted: Orders

## 2019-08-05 NOTE — Telephone Encounter (Signed)
Please advise 

## 2019-08-05 NOTE — Telephone Encounter (Signed)
Sent!

## 2019-08-23 ENCOUNTER — Telehealth: Payer: Self-pay | Admitting: Family Medicine

## 2019-08-23 MED ORDER — TRAMADOL HCL 50 MG PO TABS: 50.0000 mg | ORAL_TABLET | Freq: Three times a day (TID) | ORAL | 0 refills | Status: DC | PRN

## 2019-08-23 NOTE — Telephone Encounter (Signed)
Please advise 

## 2019-08-23 NOTE — Telephone Encounter (Signed)
Patient called requesting an RX refill on his Tramadol.  Patient uses [facility] on [address].  CB#[phone].  Thank you.

## 2019-08-23 NOTE — Telephone Encounter (Signed)
I called and advised the patient.  He had another question about his Tsp MRI. He has not heard anything on it. I let him know it looks like the insurance authorized the one that Dr. Nelva Bush had ordered (but has never been scheduled). He said he does not plan to follow up with Dr. Nelva Bush, only Dr. Junius Roads and Dr. Noberto Retort (chiropractor). His chiropractor also tried to order an MRI for his Tsp and a special view of the chest wall to look at the lipoma - this was denied by the insurance company. I was unable to advise him on how to proceed, as I am not sure if the referral for the MRI from Dr. Junius Roads is still good. Please advise on this.

## 2019-08-23 NOTE — Telephone Encounter (Signed)
Sent!

## 2019-08-26 ENCOUNTER — Telehealth: Payer: Self-pay | Admitting: Family Medicine

## 2019-08-26 NOTE — Telephone Encounter (Signed)
Patient called stating that he has been having muscle spasms in his back for the last couple of days and wanted to know if Dr. Junius Roads would prescribe him a muscle relaxer.  He uses [facility] on [address].  CB#[phone].  Thank you.

## 2019-08-29 MED ORDER — BACLOFEN 10 MG PO TABS: 5.0000 mg | ORAL_TABLET | Freq: Three times a day (TID) | ORAL | 1 refills | Status: DC | PRN

## 2019-08-29 NOTE — Telephone Encounter (Signed)
I called the patient and advised him a muscle relaxer has been sent in to his pharmacy.

## 2019-08-29 NOTE — Telephone Encounter (Signed)
Rx sent 

## 2019-08-29 NOTE — Telephone Encounter (Signed)
Please advise 

## 2019-09-14 ENCOUNTER — Telehealth: Payer: Self-pay | Admitting: Family Medicine

## 2019-09-14 MED ORDER — TRAMADOL HCL 50 MG PO TABS: 50.0000 mg | ORAL_TABLET | Freq: Three times a day (TID) | ORAL | 0 refills | Status: DC | PRN

## 2019-09-14 NOTE — Telephone Encounter (Signed)
Please advise 

## 2019-09-14 NOTE — Telephone Encounter (Signed)
Pt called in requesting a refill on tramadol 50 MG, pt is wondering if dr.hilts can add one more tablet a day.   Please have that sent to [facility] on [address].   [phone]

## 2019-09-14 NOTE — Telephone Encounter (Signed)
Rx sent, but for the same amount.  He really should be taking less of it by now, not more.

## 2019-09-15 NOTE — Telephone Encounter (Signed)
I advised the patient about his medication. He is still waiting on his Tsp MRI - I am checking with our referral coordinator on this.

## 2019-09-29 ENCOUNTER — Telehealth: Payer: Self-pay | Admitting: *Deleted

## 2019-09-29 ENCOUNTER — Other Ambulatory Visit: Payer: Self-pay | Admitting: Family Medicine

## 2019-09-29 DIAGNOSIS — M546 Pain in thoracic spine: Secondary | ICD-10-CM

## 2019-09-29 NOTE — Telephone Encounter (Signed)
I don't think we need that.  MRI of T-spine should be sufficient.

## 2019-09-29 NOTE — Telephone Encounter (Signed)
Please advise 

## 2019-09-29 NOTE — Telephone Encounter (Signed)
Pt called stating [facility] does not see the order for TSP MRI and new order has to be placed. I placed the new order for MRI TSP, pt is also stating that Dr. Noberto Retort told pt that he would also need an MRI of Chest wall for soft tissue mass. Pt stated Dr. Junius Roads was aware of the mass, but wanted to know if he really needed to have the MRI of chest.   Please advise.

## 2019-10-03 ENCOUNTER — Telehealth: Payer: Self-pay | Admitting: Family Medicine

## 2019-10-03 ENCOUNTER — Other Ambulatory Visit: Payer: Self-pay | Admitting: Family Medicine

## 2019-10-03 MED ORDER — TRAMADOL HCL 50 MG PO TABS: 50.0000 mg | ORAL_TABLET | Freq: Every evening | ORAL | 0 refills | Status: DC | PRN

## 2019-10-03 NOTE — Telephone Encounter (Signed)
Patient left a voicemail message requesting an RX refill on his Tramadol..  [id number].  Thank you.

## 2019-10-03 NOTE — Telephone Encounter (Signed)
Too soon for refill.

## 2019-10-03 NOTE — Telephone Encounter (Signed)
Patient called needing Rx refilled (Tramadol) The number to contact patient is [phone]

## 2019-10-03 NOTE — Telephone Encounter (Signed)
I called the patient - see other message on this from today.

## 2019-10-03 NOTE — Telephone Encounter (Signed)
Rx sent with new dosing instructions.

## 2019-10-03 NOTE — Telephone Encounter (Signed)
Please advise. His MRI is on 10/18/19.

## 2019-10-03 NOTE — Telephone Encounter (Signed)
I called the patient and advised him of the new instructions on the Tramadol.

## 2019-10-06 ENCOUNTER — Telehealth: Payer: Self-pay | Admitting: Family Medicine

## 2019-10-06 NOTE — Telephone Encounter (Signed)
Patient called asked for a call back concerning his Rx for (Tramadol) Patient said a tab was taken out instead of adding one.   The number to contact patient is [phone]

## 2019-10-07 MED ORDER — TRAMADOL HCL 50 MG PO TABS: 50.0000 mg | ORAL_TABLET | Freq: Three times a day (TID) | ORAL | 0 refills | Status: DC | PRN

## 2019-10-07 NOTE — Telephone Encounter (Signed)
I called and advised the patient. He voiced understanding about trying to take these as sparingly as he can.

## 2019-10-07 NOTE — Telephone Encounter (Signed)
The patient was taking 1-1&1/2 of Tramadol every morning and 1-1&1/2 or 2 at bedtime. He is working a lot right now, wiring different buildings/hotels and is needing the medication to make it through the day. He said he felt like he was "cut off" when the directions on this new Rx said 1-2 qhs prn. He has the muscle relaxer and celebrex, but the Tramadol is working better for him. He is wanting to take 2 Tramadol bid, if possible. His MRI is 12/01. Please advise.

## 2019-10-07 NOTE — Telephone Encounter (Signed)
He really needs to be taking as few as possible.  I will send in an Rx for 1 TID prn, but try to take less than that if able.

## 2019-10-18 ENCOUNTER — Other Ambulatory Visit: Payer: Self-pay

## 2019-10-18 ENCOUNTER — Ambulatory Visit
Admission: RE | Admit: 2019-10-18 | Discharge: 2019-10-18 | Disposition: A | Discharge status: Home / Self Care | Payer: Self-pay | Source: Ambulatory Visit | Attending: Family Medicine | Admitting: Family Medicine

## 2019-10-18 DIAGNOSIS — M546 Pain in thoracic spine: Secondary | ICD-10-CM

## 2019-10-18 DIAGNOSIS — M5124 Other intervertebral disc displacement, thoracic region: Secondary | ICD-10-CM | POA: Diagnosis not present

## 2019-10-19 ENCOUNTER — Telehealth: Payer: Self-pay | Admitting: Family Medicine

## 2019-10-19 NOTE — Telephone Encounter (Signed)
Thoracic MRI scan shows the following:  There are mild disc protrusions at T4-5, T5-6 but they do not resulted any spinal cord or nerve impingement.  These findings are the same as previous MRI scan from 2019.  There is a slightly larger left-sided protrusion at T9-10 which does not result any narrowing of the spinal canal or nerve openings.  This is new compared to previous MRI scan.  There is no indication for surgery.  If symptoms do not improve with continued chiropractic, could either try physical therapy or an epidural steroid injection.

## 2019-10-28 ENCOUNTER — Telehealth: Payer: Self-pay | Admitting: Family Medicine

## 2019-10-28 NOTE — Telephone Encounter (Signed)
Patient called. Would like to know the results from the MRI. His call back number is [phone]

## 2019-10-28 NOTE — Telephone Encounter (Signed)
The patient read the results that were already sent to him through [place] from Dr. Junius Roads, after he called the office for results today.

## 2019-10-31 ENCOUNTER — Telehealth: Payer: Self-pay | Admitting: Family Medicine

## 2019-10-31 MED ORDER — BACLOFEN 10 MG PO TABS: 5.0000 mg | ORAL_TABLET | Freq: Three times a day (TID) | ORAL | 1 refills | Status: AC | PRN

## 2019-10-31 MED ORDER — TRAMADOL HCL 50 MG PO TABS: 50.0000 mg | ORAL_TABLET | Freq: Three times a day (TID) | ORAL | 0 refills | Status: DC | PRN

## 2019-10-31 NOTE — Telephone Encounter (Signed)
Rx sent.  Ok to schedule ov to discuss further, or he can also send a Pharmacist, community message.

## 2019-10-31 NOTE — Telephone Encounter (Signed)
I called the patient and advised him his medications were sent in to his pharmacy. He did read Dr. Junius Roads' message about the MRI results, but he would like to have them explained in more detail in order to help decide on the next step. He made an appointment for tomorrow at 3 with Dr. Junius Roads.

## 2019-10-31 NOTE — Telephone Encounter (Signed)
Patient called wanting a refill Tramadol. Also muscle relaxer.  Please call patient about the results from MRI  [phone]

## 2019-11-01 ENCOUNTER — Encounter: Payer: Self-pay | Admitting: Family Medicine

## 2019-11-01 ENCOUNTER — Other Ambulatory Visit: Payer: Self-pay

## 2019-11-01 ENCOUNTER — Ambulatory Visit (INDEPENDENT_AMBULATORY_CARE_PROVIDER_SITE_OTHER): Payer: BC Managed Care – PPO | Admitting: Family Medicine

## 2019-11-01 DIAGNOSIS — M546 Pain in thoracic spine: Secondary | ICD-10-CM
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
## 2019-11-08 ENCOUNTER — Encounter: Payer: Self-pay | Admitting: Orthopaedic Surgery

## 2019-11-08 ENCOUNTER — Ambulatory Visit: Payer: BC Managed Care – PPO | Admitting: Orthopaedic Surgery

## 2019-11-08 ENCOUNTER — Other Ambulatory Visit: Payer: Self-pay

## 2019-11-08 DIAGNOSIS — D1721 Benign lipomatous neoplasm of skin and subcutaneous tissue of right arm: Secondary | ICD-10-CM | POA: Diagnosis not present

## 2019-11-08 DIAGNOSIS — D171 Benign lipomatous neoplasm of skin and subcutaneous tissue of trunk: Secondary | ICD-10-CM | POA: Diagnosis not present

## 2019-11-08 NOTE — Progress Notes (Signed)
Office Visit Note   Patient: Kevin Robbins           Date of Birth: 12-14-1990           MRN: [id number] Visit Date: 11/08/2019              Requested by: Kevin Koch, MD [place],  [place] [id number] PCP: Kevin Koch, MD   Assessment & Plan: Visit Diagnoses:  1. Lipoma of back   2. Lipoma of right shoulder     Plan: Impression is symptomatic right posterior shoulder lipoma.  I reviewed the MRI scan of the thoracic spine which shows no abnormal features.  Based on discussion today patient has elected to proceed with scheduling for lipoma removal.  Patient does have von Willebrand's disease therefore we will plan for meticulous hemostasis intraoperatively.  Questions encouraged and answered.  Follow-Up Instructions: No follow-ups on file.   Orders:  No orders of the defined types were placed in this encounter.  No orders of the defined types were placed in this encounter.     Procedures: No procedures performed   Clinical Data: No additional findings.   Subjective: Chief Complaint  Patient presents with  . Middle Back - Pain    Kevin Robbins is a is a very pleasant 28 year old gentleman who comes in for evaluation of a posterior shoulder and mid back lipoma and is symptomatic.  He states that this is bothersome when he lays on his back.  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  He states that originally thought it was a knot in his muscle but after multiple massages they realized that this was something else.  He has had a previous lipoma taken out of his lower back by general surgery.  He unfortunately had postoperative hematoma was due to his von Willebrand's disease.  He has been referred to [place] by Dr. Junius Robbins for removal.  He denies any history of cancer or constitutional symptoms.  He states that he has noticed that has gotten slightly larger in size.   Review of Systems  Constitutional: Negative.   All other  systems reviewed and are negative.    Objective: Vital Signs: There were no vitals taken for this visit.  Physical Exam Vitals and nursing note reviewed.  Constitutional:      Appearance: He is well-developed.  Pulmonary:     Effort: Pulmonary effort is normal.  Abdominal:     Palpations: Abdomen is soft.  Skin:    General: Skin is warm.  Neurological:     Mental Status: He is alert and oriented to person, place, and time.  Psychiatric:        Behavior: Behavior normal.        Thought Content: Thought content normal.        Judgment: Judgment normal.     Ortho Exam He has a palpable firm mass that semimobile on the posterior aspect of his right scapula.  There are no overlying skin changes.  The mass is well-defined.  He has full shoulder mobility.  This is slightly tender to palpation. Specialty Comments:  No specialty comments available.  Imaging: No results found.   PMFS History: Patient Active Problem List   Diagnosis Date Noted  . Lipoma of back 11/08/2019  . Lipoma of right shoulder 11/08/2019  . Acute neck pain 06/20/2019  . Acute back pain 10/22/2018  . Routine general medical examination at a health care facility  08/02/2018  . Thrombocytopenia ([place]) 12/20/2014   Past Medical History:  Diagnosis Date  . Von Willebrand disease ([place])     Family History  Problem Relation Age of Onset  . Drug abuse Other   . Von Willebrand disease Other     Past Surgical History:  Procedure Laterality Date  . KNEE SURGERY Left 2017  . KNEE SURGERY Right 2018  . lipoma removal  N/A 2018   Social History   Occupational History  . Not on file  Tobacco Use  . Smoking status: Never Smoker  . Smokeless tobacco: Never Used  Substance and Sexual Activity  . Alcohol use: No  . Drug use: No  . Sexual activity: Not on file

## 2019-11-15 ENCOUNTER — Other Ambulatory Visit: Payer: Self-pay

## 2019-11-15 ENCOUNTER — Encounter (HOSPITAL_BASED_OUTPATIENT_CLINIC_OR_DEPARTMENT_OTHER): Payer: Self-pay | Admitting: Orthopaedic Surgery

## 2019-11-19 ENCOUNTER — Other Ambulatory Visit (HOSPITAL_COMMUNITY)
Admission: RE | Admit: 2019-11-19 | Discharge: 2019-11-19 | Disposition: A | Discharge status: Home / Self Care | Payer: BC Managed Care – PPO | Source: Ambulatory Visit | Attending: Orthopaedic Surgery | Admitting: Orthopaedic Surgery

## 2019-11-19 DIAGNOSIS — Z20822 Contact with and (suspected) exposure to covid-19: Secondary | ICD-10-CM | POA: Diagnosis not present

## 2019-11-19 DIAGNOSIS — Z01812 Encounter for preprocedural laboratory examination: Secondary | ICD-10-CM | POA: Insufficient documentation

## 2019-11-20 LAB — NOVEL CORONAVIRUS, NAA (HOSP ORDER, SEND-OUT TO REF LAB; TAT 18-24 HRS): SARS-CoV-2, NAA: NOT DETECTED

## 2019-11-21 ENCOUNTER — Other Ambulatory Visit: Payer: Self-pay

## 2019-11-21 ENCOUNTER — Telehealth: Payer: Self-pay | Admitting: Family Medicine

## 2019-11-21 ENCOUNTER — Other Ambulatory Visit (HOSPITAL_COMMUNITY): Payer: BC Managed Care – PPO

## 2019-11-21 MED ORDER — TRAMADOL HCL 50 MG PO TABS: 50.0000 mg | ORAL_TABLET | Freq: Three times a day (TID) | ORAL | 0 refills | Status: DC | PRN

## 2019-11-21 NOTE — Telephone Encounter (Signed)
I called and advised the patient his medication was refilled.

## 2019-11-21 NOTE — Telephone Encounter (Signed)
Patient called. He would like a refill on Tramadol. His call back number is [phone]

## 2019-11-21 NOTE — Telephone Encounter (Signed)
Sent!

## 2019-11-21 NOTE — Telephone Encounter (Signed)
Please advise 

## 2019-11-22 ENCOUNTER — Encounter (HOSPITAL_BASED_OUTPATIENT_CLINIC_OR_DEPARTMENT_OTHER): Payer: Self-pay | Admitting: Orthopaedic Surgery

## 2019-11-22 NOTE — Anesthesia Preprocedure Evaluation (Addendum)
Anesthesia Evaluation  Patient identified by MRN, date of birth, ID band Patient awake    Reviewed: Allergy & Precautions, NPO status , Patient's Chart, lab work & pertinent test results  Airway Mallampati: II  TM Distance: >3 FB Neck ROM: Full    Dental no notable dental hx. (+) Teeth Intact   Pulmonary neg pulmonary ROS,    Pulmonary exam normal breath sounds clear to auscultation       Cardiovascular negative cardio ROS Normal cardiovascular exam Rhythm:Regular Rate:Normal     Neuro/Psych negative neurological ROS  negative psych ROS   GI/Hepatic negative GI ROS, Neg liver ROS,   Endo/Other  Obesity  Renal/GU negative Renal ROS  negative genitourinary   Musculoskeletal Lipoma right shoulder   Abdominal (+) + obese,   Peds  Hematology Von Willebrand Disease Thrombocytopenia- mild   Anesthesia Other Findings   Reproductive/Obstetrics                           Anesthesia Physical Anesthesia Plan  ASA: II  Anesthesia Plan: General   Post-op Pain Management:    Induction: Intravenous  PONV Risk Score and Plan: 3 and Midazolam, Ondansetron, Dexamethasone and Treatment may vary due to age or medical condition  Airway Management Planned: LMA  Additional Equipment:   Intra-op Plan:   Post-operative Plan: Extubation in OR  Informed Consent: I have reviewed the patients History and Physical, chart, labs and discussed the procedure including the risks, benefits and alternatives for the proposed anesthesia with the patient or authorized representative who has indicated his/her understanding and acceptance.     Dental advisory given  Plan Discussed with: CRNA and Surgeon  Anesthesia Plan Comments:        Anesthesia Quick Evaluation

## 2019-11-23 ENCOUNTER — Encounter (HOSPITAL_BASED_OUTPATIENT_CLINIC_OR_DEPARTMENT_OTHER)
Admission: RE | Disposition: A | Discharge status: Home / Self Care | Payer: Self-pay | Source: Home / Self Care | Attending: Orthopaedic Surgery

## 2019-11-23 ENCOUNTER — Ambulatory Visit (HOSPITAL_BASED_OUTPATIENT_CLINIC_OR_DEPARTMENT_OTHER): Payer: BC Managed Care – PPO | Admitting: Anesthesiology

## 2019-11-23 ENCOUNTER — Other Ambulatory Visit: Payer: Self-pay

## 2019-11-23 ENCOUNTER — Ambulatory Visit (HOSPITAL_BASED_OUTPATIENT_CLINIC_OR_DEPARTMENT_OTHER)
Admission: RE | Admit: 2019-11-23 | Discharge: 2019-11-23 | Disposition: A | Discharge status: Home / Self Care | Payer: BC Managed Care – PPO | Attending: Orthopaedic Surgery | Admitting: Orthopaedic Surgery

## 2019-11-23 ENCOUNTER — Encounter (HOSPITAL_BASED_OUTPATIENT_CLINIC_OR_DEPARTMENT_OTHER): Payer: Self-pay | Admitting: Orthopaedic Surgery

## 2019-11-23 DIAGNOSIS — D1721 Benign lipomatous neoplasm of skin and subcutaneous tissue of right arm: Secondary | ICD-10-CM

## 2019-11-23 DIAGNOSIS — E669 Obesity, unspecified: Secondary | ICD-10-CM | POA: Diagnosis not present

## 2019-11-23 DIAGNOSIS — Z79899 Other long term (current) drug therapy: Secondary | ICD-10-CM | POA: Insufficient documentation

## 2019-11-23 DIAGNOSIS — Z6833 Body mass index (BMI) 33.0-33.9, adult: Secondary | ICD-10-CM | POA: Diagnosis not present

## 2019-11-23 DIAGNOSIS — Z791 Long term (current) use of non-steroidal anti-inflammatories (NSAID): Secondary | ICD-10-CM | POA: Insufficient documentation

## 2019-11-23 DIAGNOSIS — D680 Von Willebrand's disease: Secondary | ICD-10-CM | POA: Diagnosis not present

## 2019-11-23 DIAGNOSIS — D171 Benign lipomatous neoplasm of skin and subcutaneous tissue of trunk: Secondary | ICD-10-CM | POA: Insufficient documentation

## 2019-11-23 DIAGNOSIS — D696 Thrombocytopenia, unspecified: Secondary | ICD-10-CM | POA: Diagnosis not present

## 2019-11-23 HISTORY — PX: LIPOMA EXCISION: SHX5283

## 2019-11-23 SURGERY — EXCISION LIPOMA
Anesthesia: General | Site: Shoulder | Laterality: Right

## 2019-11-23 MED ORDER — LIDOCAINE HCL (CARDIAC) PF 100 MG/5ML IV SOSY
PREFILLED_SYRINGE | INTRAVENOUS | Status: DC | PRN
  Administered 2019-11-23: 80 mg via INTRAVENOUS

## 2019-11-23 MED ORDER — DEXAMETHASONE SODIUM PHOSPHATE 10 MG/ML IJ SOLN
INTRAMUSCULAR | Status: DC | PRN
  Administered 2019-11-23: 10 mg via INTRAVENOUS

## 2019-11-23 MED ORDER — LIDOCAINE 2% (20 MG/ML) 5 ML SYRINGE
INTRAMUSCULAR | Status: AC
  Filled 2019-11-23: qty 5

## 2019-11-23 MED ORDER — FENTANYL CITRATE (PF) 100 MCG/2ML IJ SOLN
INTRAMUSCULAR | Status: AC
  Filled 2019-11-23: qty 2

## 2019-11-23 MED ORDER — THROMBIN 20000 UNITS EX KIT
PACK | CUTANEOUS | Status: DC | PRN
  Administered 2019-11-23: 5000 [IU] via TOPICAL

## 2019-11-23 MED ORDER — PROPOFOL 10 MG/ML IV BOLUS
INTRAVENOUS | Status: DC | PRN
  Administered 2019-11-23: 50 mg via INTRAVENOUS
  Administered 2019-11-23: 200 mg via INTRAVENOUS

## 2019-11-23 MED ORDER — ROCURONIUM BROMIDE 10 MG/ML (PF) SYRINGE
PREFILLED_SYRINGE | INTRAVENOUS | Status: AC
  Filled 2019-11-23: qty 10

## 2019-11-23 MED ORDER — HYDROCODONE-ACETAMINOPHEN 5-325 MG PO TABS: 1.0000 | ORAL_TABLET | Freq: Three times a day (TID) | ORAL | 0 refills | Status: AC | PRN

## 2019-11-23 MED ORDER — OXYCODONE HCL 5 MG/5ML PO SOLN: 5.0000 mg | Freq: Once | ORAL | Status: AC | PRN

## 2019-11-23 MED ORDER — LACTATED RINGERS IV SOLN: INTRAVENOUS | Status: DC

## 2019-11-23 MED ORDER — LIDOCAINE-EPINEPHRINE 2 %-1:100000 IJ SOLN
INTRAMUSCULAR | Status: AC
  Filled 2019-11-23: qty 1

## 2019-11-23 MED ORDER — MIDAZOLAM HCL 2 MG/2ML IJ SOLN
INTRAMUSCULAR | Status: DC | PRN
  Administered 2019-11-23: 2 mg via INTRAVENOUS

## 2019-11-23 MED ORDER — OXYCODONE HCL 5 MG PO TABS
ORAL_TABLET | ORAL | Status: AC
  Filled 2019-11-23: qty 1

## 2019-11-23 MED ORDER — LIDOCAINE-EPINEPHRINE 1 %-1:100000 IJ SOLN
INTRAMUSCULAR | Status: DC | PRN
  Administered 2019-11-23: 19 mL

## 2019-11-23 MED ORDER — ONDANSETRON HCL 4 MG/2ML IJ SOLN: 4.0000 mg | Freq: Once | INTRAMUSCULAR | Status: DC | PRN

## 2019-11-23 MED ORDER — FENTANYL CITRATE (PF) 100 MCG/2ML IJ SOLN
25.0000 ug | INTRAMUSCULAR | Status: DC | PRN
  Administered 2019-11-23 (×3): 50 ug via INTRAVENOUS

## 2019-11-23 MED ORDER — METHOCARBAMOL 750 MG PO TABS: 750.0000 mg | ORAL_TABLET | Freq: Two times a day (BID) | ORAL | 3 refills | Status: AC | PRN

## 2019-11-23 MED ORDER — ROCURONIUM BROMIDE 10 MG/ML (PF) SYRINGE
PREFILLED_SYRINGE | INTRAVENOUS | Status: DC | PRN
  Administered 2019-11-23: 50 mg via INTRAVENOUS

## 2019-11-23 MED ORDER — SUGAMMADEX SODIUM 500 MG/5ML IV SOLN
INTRAVENOUS | Status: AC
  Filled 2019-11-23: qty 5

## 2019-11-23 MED ORDER — CEFAZOLIN SODIUM-DEXTROSE 2-4 GM/100ML-% IV SOLN
2.0000 g | INTRAVENOUS | Status: AC
  Administered 2019-11-23: 2 g via INTRAVENOUS

## 2019-11-23 MED ORDER — THROMBIN 5000 UNITS EX SOLR
CUTANEOUS | Status: AC
  Filled 2019-11-23: qty 5000

## 2019-11-23 MED ORDER — MIDAZOLAM HCL 2 MG/2ML IJ SOLN
INTRAMUSCULAR | Status: AC
  Filled 2019-11-23: qty 2

## 2019-11-23 MED ORDER — OXYCODONE HCL 5 MG PO TABS
5.0000 mg | ORAL_TABLET | Freq: Once | ORAL | Status: AC | PRN
  Administered 2019-11-23: 10:00:00 5 mg via ORAL

## 2019-11-23 MED ORDER — ONDANSETRON HCL 4 MG/2ML IJ SOLN
INTRAMUSCULAR | Status: AC
  Filled 2019-11-23: qty 2

## 2019-11-23 MED ORDER — ONDANSETRON HCL 4 MG/2ML IJ SOLN
INTRAMUSCULAR | Status: DC | PRN
  Administered 2019-11-23: 4 mg via INTRAVENOUS

## 2019-11-23 MED ORDER — CEFAZOLIN SODIUM-DEXTROSE 2-4 GM/100ML-% IV SOLN
INTRAVENOUS | Status: AC
  Filled 2019-11-23: qty 100

## 2019-11-23 MED ORDER — FENTANYL CITRATE (PF) 100 MCG/2ML IJ SOLN
INTRAMUSCULAR | Status: DC | PRN
  Administered 2019-11-23 (×2): 100 ug via INTRAVENOUS

## 2019-11-23 MED ORDER — CHLORHEXIDINE GLUCONATE 4 % EX LIQD: 60.0000 mL | Freq: Once | CUTANEOUS | Status: DC

## 2019-11-23 MED ORDER — PROPOFOL 10 MG/ML IV BOLUS
INTRAVENOUS | Status: AC
  Filled 2019-11-23: qty 40

## 2019-11-23 MED ORDER — SUGAMMADEX SODIUM 200 MG/2ML IV SOLN
INTRAVENOUS | Status: DC | PRN
  Administered 2019-11-23: 240 mg via INTRAVENOUS

## 2019-11-23 MED ORDER — LIDOCAINE-EPINEPHRINE 1 %-1:100000 IJ SOLN
INTRAMUSCULAR | Status: AC
  Filled 2019-11-23: qty 1

## 2019-11-23 MED ORDER — DEXAMETHASONE SODIUM PHOSPHATE 10 MG/ML IJ SOLN
INTRAMUSCULAR | Status: AC
  Filled 2019-11-23: qty 1

## 2019-11-23 SURGICAL SUPPLY — 46 items
BENZOIN TINCTURE PRP APPL 2/3 (GAUZE/BANDAGES/DRESSINGS) IMPLANT
BLADE SURG 15 STRL LF DISP TIS (BLADE) ×2 IMPLANT
BLADE SURG 15 STRL SS (BLADE) ×2
COVER WAND RF STERILE (DRAPES) IMPLANT
DRAPE IMP U-DRAPE 54X76 (DRAPES) ×1 IMPLANT
DRAPE INCISE IOBAN 66X45 STRL (DRAPES) ×2 IMPLANT
DRAPE LAPAROTOMY 100X72 PEDS (DRAPES) ×1 IMPLANT
DRAPE U-SHAPE 47X51 STRL (DRAPES) ×2 IMPLANT
DRAPE U-SHAPE 76X120 STRL (DRAPES) ×2 IMPLANT
DRSG MEPILEX BORDER 4X8 (GAUZE/BANDAGES/DRESSINGS) IMPLANT
DRSG PAD ABDOMINAL 8X10 ST (GAUZE/BANDAGES/DRESSINGS) ×2 IMPLANT
DURAPREP 26ML APPLICATOR (WOUND CARE) ×2 IMPLANT
ELECT REM PT RETURN 9FT ADLT (ELECTROSURGICAL) ×2
ELECTRODE REM PT RTRN 9FT ADLT (ELECTROSURGICAL) ×1 IMPLANT
GAUZE SPONGE 4X4 12PLY STRL (GAUZE/BANDAGES/DRESSINGS) ×2 IMPLANT
GLOVE BIO SURGEON STRL SZ 6.5 (GLOVE) ×1 IMPLANT
GLOVE BIOGEL PI IND STRL 7.0 (GLOVE) ×1 IMPLANT
GLOVE BIOGEL PI INDICATOR 7.0 (GLOVE) ×2
GLOVE ECLIPSE 7.0 STRL STRAW (GLOVE) ×2 IMPLANT
GLOVE SKINSENSE NS SZ7.5 (GLOVE) ×1
GLOVE SKINSENSE STRL SZ7.5 (GLOVE) ×1 IMPLANT
GLOVE SURG SYN 7.5  E (GLOVE) ×1
GLOVE SURG SYN 7.5 E (GLOVE) ×1 IMPLANT
GLOVE SURG SYN 7.5 PF PI (GLOVE) ×1 IMPLANT
GOWN STRL REIN XL XLG (GOWN DISPOSABLE) ×2 IMPLANT
GOWN STRL REUS W/ TWL LRG LVL3 (GOWN DISPOSABLE) ×1 IMPLANT
GOWN STRL REUS W/ TWL XL LVL3 (GOWN DISPOSABLE) ×1 IMPLANT
GOWN STRL REUS W/TWL LRG LVL3 (GOWN DISPOSABLE) ×1
GOWN STRL REUS W/TWL XL LVL3 (GOWN DISPOSABLE) ×3 IMPLANT
MANIFOLD NEPTUNE II (INSTRUMENTS) IMPLANT
PACK ARTHROSCOPY DSU (CUSTOM PROCEDURE TRAY) ×2 IMPLANT
PACK BASIN DAY SURGERY FS (CUSTOM PROCEDURE TRAY) ×2 IMPLANT
PENCIL SMOKE EVACUATOR (MISCELLANEOUS) ×2 IMPLANT
SLEEVE SCD COMPRESS KNEE MED (MISCELLANEOUS) ×2 IMPLANT
SPONGE LAP 18X18 RF (DISPOSABLE) ×2 IMPLANT
STRIP CLOSURE SKIN 1/2X4 (GAUZE/BANDAGES/DRESSINGS) IMPLANT
SUCTION FRAZIER HANDLE 10FR (MISCELLANEOUS)
SUCTION TUBE FRAZIER 10FR DISP (MISCELLANEOUS) ×1 IMPLANT
SUT MNCRL AB 4-0 PS2 18 (SUTURE) ×1 IMPLANT
SUT VIC AB 0 CT1 27 (SUTURE)
SUT VIC AB 0 CT1 27XBRD ANBCTR (SUTURE) ×1 IMPLANT
SUT VIC AB 2-0 CT1 27 (SUTURE) ×1
SUT VIC AB 2-0 CT1 TAPERPNT 27 (SUTURE) ×1 IMPLANT
SYR BULB 3OZ (MISCELLANEOUS) ×2 IMPLANT
TOWEL GREEN STERILE FF (TOWEL DISPOSABLE) ×2 IMPLANT
YANKAUER SUCT BULB TIP NO VENT (SUCTIONS) ×2 IMPLANT

## 2019-11-23 NOTE — Discharge Instructions (Signed)
Postoperative instructions:  Keep your dressing clean and dry at all times.  You can remove your dressing on post-operative day #3 and change with a dry/sterile dressing needed thereafter.    Incision instructions:  Do not soak your incision for 3 weeks after surgery.  If the incision gets wet, pat dry and do not scrub the incision.  Pain control:  You have been given a prescription to be taken as directed for post-operative pain control.  In addition, elevate the operative extremity above the heart at all times to prevent swelling and throbbing pain.  Take over-the-counter Colace, 100mg  by mouth twice a day while taking narcotic pain medications to help prevent constipation.  Follow up appointments: 1) 14 days days for suture removal and wound check. 2) Dr. Erlinda Hong as scheduled.   -------------------------------------------------------------------------------------------------------------  After Surgery Pain Control:  After your surgery, post-surgical discomfort or pain is likely. This discomfort can last several days to a few weeks. At certain times of the day your discomfort may be more intense.  Did you receive a nerve block?  A nerve block can provide pain relief for one hour to two days after your surgery. As long as the nerve block is working, you will experience little or no sensation in the area the surgeon operated on.  As the nerve block wears off, you will begin to experience pain or discomfort. It is very important that you begin taking your prescribed pain medication before the nerve block fully wears off. Treating your pain at the first sign of the block wearing off will ensure your pain is better controlled and more tolerable when full-sensation returns. Do not wait until the pain is intolerable, as the medicine will be less effective. It is better to treat pain in advance than to try and catch up.  General Anesthesia:  If you did not receive a nerve block during your surgery,  you will need to start taking your pain medication shortly after your surgery and should continue to do so as prescribed by your surgeon.  Pain Medication:  Most commonly we prescribe Vicodin and Percocet for post-operative pain. Both of these medications contain a combination of acetaminophen (Tylenol) and a narcotic to help control pain.   It takes between 30 and 45 minutes before pain medication starts to work. It is important to take your medication before your pain level gets too intense.   Nausea is a common side effect of many pain medications. You will want to eat something before taking your pain medicine to help prevent nausea.   If you are taking a prescription pain medication that contains acetaminophen, we recommend that you do not take additional over the counter acetaminophen (Tylenol).  Other pain relieving options:   Using a cold pack to ice the affected area a few times a day (15 to 20 minutes at a time) can help to relieve pain, reduce swelling and bruising.   Elevation of the affected area can also help to reduce pain and swelling.   Post Anesthesia Home Care Instructions  Activity: Get plenty of rest for the remainder of the day. A responsible individual must stay with you for 24 hours following the procedure.  For the next 24 hours, DO NOT: -Drive a car -Paediatric nurse -Drink alcoholic beverages -Take any medication unless instructed by your physician -Make any legal decisions or sign important papers.  Meals: Start with liquid foods such as gelatin or soup. Progress to regular foods as tolerated. Avoid greasy, spicy,  heavy foods. If nausea and/or vomiting occur, drink only clear liquids until the nausea and/or vomiting subsides. Call your physician if vomiting continues.  Special Instructions/Symptoms: Your throat may feel dry or sore from the anesthesia or the breathing tube placed in your throat during surgery. If this causes discomfort, gargle with warm  salt water. The discomfort should disappear within 24 hours.  If you had a scopolamine patch placed behind your ear for the management of post- operative nausea and/or vomiting:  1. The medication in the patch is effective for 72 hours, after which it should be removed.  Wrap patch in a tissue and discard in the trash. Wash hands thoroughly with soap and water. 2. You may remove the patch earlier than 72 hours if you experience unpleasant side effects which may include dry mouth, dizziness or visual disturbances. 3. Avoid touching the patch. Wash your hands with soap and water after contact with the patch.

## 2019-11-23 NOTE — Anesthesia Procedure Notes (Signed)
Procedure Name: Intubation Date/Time: 11/23/2019 8:32 AM Performed by: Raenette Rover, CRNA Pre-anesthesia Checklist: Patient identified, Emergency Drugs available, Suction available and Patient being monitored Patient Re-evaluated:Patient Re-evaluated prior to induction Oxygen Delivery Method: Circle system utilized Preoxygenation: Pre-oxygenation with 100% oxygen Induction Type: IV induction Ventilation: Mask ventilation without difficulty Laryngoscope Size: Mac and 3 Grade View: Grade I Tube type: Oral Tube size: 7.0 mm Number of attempts: 1 Airway Equipment and Method: Stylet Placement Confirmation: ETT inserted through vocal cords under direct vision,  positive ETCO2 and breath sounds checked- equal and bilateral Secured at: 22 cm Tube secured with: Tape Dental Injury: Teeth and Oropharynx as per pre-operative assessment

## 2019-11-23 NOTE — Op Note (Signed)
   Date of Surgery: 11/23/2019  INDICATIONS: Kevin Robbins is a 29 y.o.-year-old male with a symptomatic lipoma of the upper upper region;  The patient did consent to the procedure after discussion of the risks and benefits.  PREOPERATIVE DIAGNOSIS: Subcutaneous lipoma 5 x 5.5 cm of the right scapular region.  POSTOPERATIVE DIAGNOSIS: Same.  PROCEDURE: Removal of subcutaneous lipoma from right scapular region 5 x 5.5 cm  SURGEON: N. Eduard Roux, M.D.  ASSIST: Kevin Robbins, Kevin Robbins; necessary for the timely completion of procedure and due to complexity of procedure.  ANESTHESIA:  general  IV FLUIDS AND URINE: See anesthesia.  ESTIMATED BLOOD LOSS: minimal mL.  IMPLANTS: none  DRAINS: none  COMPLICATIONS: see description of procedure.  DESCRIPTION OF PROCEDURE: The patient was brought to the operating room and placed prone on the operating table.  The patient had been signed prior to the procedure and this was documented. The patient had the anesthesia placed by the anesthesiologist.  A time-out was performed to confirm that this was the correct patient, site, side and location. The patient did receive antibiotics prior to the incision and was re-dosed during the procedure as needed at indicated intervals. The patient had the operative site prepped and draped in the standard surgical fashion.    A transverse incision was made directly over the palpable lipoma.  Meticulous hemostasis was performed with Bovie while blunt dissection was performed using tenotomy scissors and my finger.  Retractors were placed for visualization.  Using palpation I was able to find the borders of the lipoma.  This was carefully and bluntly dissected off of the undersurface of the dermis and the underlying fascia.  The lipoma was removed without difficulty and demonstrated no aggressive features.  Once the lipoma was fully removed the surgical wound was thoroughly irrigated with normal saline.  1% lidocaine with  epinephrine was infiltrated.  5000 units of thrombin spray was placed throughout the surgical wound.  The entire surgical wound was carefully inspected for any evidence of bleeding.  A layer closure was then performed with 0 Vicryl, 2-0 Vicryl, 3-0 nylon.  Sterile dressings were applied.  Patient tolerated the procedure well had no immediate complications.  POSTOPERATIVE PLAN: Discharge home and follow-up in 2 weeks for suture removal.  Azucena Cecil, MD 9:26 AM

## 2019-11-23 NOTE — H&P (Signed)
PREOPERATIVE H&P  Chief Complaint: right posterior shoulder lipoma  HPI: Kevin Robbins is a 29 y.o. male who presents for surgical treatment of right posterior shoulder lipoma.  He denies any changes in medical history.  Past Medical History:  Diagnosis Date  . Von Willebrand disease ([place])    Past Surgical History:  Procedure Laterality Date  . KNEE SURGERY Left 2017  . KNEE SURGERY Right 2018  . lipoma removal  N/A 2018   Social History   Socioeconomic History  . Marital status: Married    Spouse name: Not on file  . Number of children: Not on file  . Years of education: Not on file  . Highest education level: Not on file  Occupational History  . Not on file  Tobacco Use  . Smoking status: Never Smoker  . Smokeless tobacco: Never Used  Substance and Sexual Activity  . Alcohol use: No  . Drug use: No  . Sexual activity: Not on file  Other Topics Concern  . Not on file  Social History Narrative  . Not on file   Social Determinants of Health   Financial Resource Strain:   . Difficulty of Paying Living Expenses: Not on file  Food Insecurity:   . Worried About Charity fundraiser in the Last Year: Not on file  . Ran Out of Food in the Last Year: Not on file  Transportation Needs:   . Lack of Transportation (Medical): Not on file  . Lack of Transportation (Non-Medical): Not on file  Physical Activity:   . Days of Exercise per Week: Not on file  . Minutes of Exercise per Session: Not on file  Stress:   . Feeling of Stress : Not on file  Social Connections:   . Frequency of Communication with Friends and Family: Not on file  . Frequency of Social Gatherings with Friends and Family: Not on file  . Attends Religious Services: Not on file  . Active Member of Clubs or Organizations: Not on file  . Attends Archivist Meetings: Not on file  . Marital Status: Not on file   Family History  Problem Relation Age of Onset  . Drug abuse Other   . Von  Willebrand disease Other    No Known Allergies Prior to Admission medications   Medication Sig Start Date End Date Taking? Authorizing Provider  acetaminophen (TYLENOL) 325 MG tablet Take 650 mg by mouth every 6 (six) hours as needed.   Yes [provider]  baclofen (LIORESAL) 10 MG tablet Take 0.5-1 tablets (5-10 mg total) by mouth 3 (three) times daily as needed for muscle spasms. 10/31/19  Yes Hilts, Legrand Como, MD  celecoxib (CELEBREX) 200 MG capsule Take 1 capsule (200 mg total) by mouth 2 (two) times daily as needed. 07/18/19  Yes Hilts, Legrand Como, MD  traMADol (ULTRAM) 50 MG tablet Take 1 tablet (50 mg total) by mouth 3 (three) times daily as needed. 11/21/19  Yes Hilts, Legrand Como, MD  ketorolac (TORADOL) 10 MG tablet Take 1 tablet (10 mg total) by mouth every 6 (six) hours as needed. 06/20/19   Hoyt Koch, MD  VYVANSE 20 MG capsule TK 1 C PO D.. 10/19/18   [provider]     Positive ROS: All other systems have been reviewed and were otherwise negative with the exception of those mentioned in the HPI and as above.  Physical Exam: General: Alert, no acute distress Cardiovascular: No pedal edema Respiratory: No cyanosis, no  use of accessory musculature GI: abdomen soft Skin: No lesions in the area of chief complaint Neurologic: Sensation intact distally Psychiatric: Patient is competent for consent with normal mood and affect Lymphatic: no lymphedema  MUSCULOSKELETAL: exam stable  Assessment: right posterior shoulder lipoma  Plan: Plan for Procedure(s): RIGHT SHOUDLER LIPOMA REMOVAL  The risks benefits and alternatives were discussed with the patient including but not limited to the risks of nonoperative treatment, versus surgical intervention including infection, bleeding, nerve injury,  blood clots, cardiopulmonary complications, morbidity, mortality, among others, and they were willing to proceed.   Eduard Roux, MD   11/23/2019 7:27 AM

## 2019-11-23 NOTE — Anesthesia Postprocedure Evaluation (Signed)
Anesthesia Post Note  Patient: Kevin Robbins  Procedure(s) Performed: RIGHT SHOUDLER LIPOMA REMOVAL (Right Shoulder)     Patient location during evaluation: PACU Anesthesia Type: General Level of consciousness: awake and alert and oriented Pain management: pain level controlled Vital Signs Assessment: post-procedure vital signs reviewed and stable Respiratory status: spontaneous breathing, nonlabored ventilation and respiratory function stable Cardiovascular status: blood pressure returned to baseline and stable Postop Assessment: no apparent nausea or vomiting Anesthetic complications: no    Last Vitals:  Vitals:   11/23/19 1010 11/23/19 1015  BP:  125/61  Pulse: 85 85  Resp: 15 16  Temp:    SpO2: 99% 100%    Last Pain:  Vitals:   11/23/19 1015  TempSrc:   PainSc: 4                  Rogan Wigley A.

## 2019-11-23 NOTE — Transfer of Care (Signed)
Immediate Anesthesia Transfer of Care Note  Patient: Kevin Robbins  Procedure(s) Performed: RIGHT SHOUDLER LIPOMA REMOVAL (Right Shoulder)  Patient Location: PACU  Anesthesia Type:General  Level of Consciousness: awake, alert , oriented and patient cooperative  Airway & Oxygen Therapy: Patient Spontanous Breathing and Patient connected to face mask oxygen  Post-op Assessment: Report given to RN and Post -op Vital signs reviewed and stable  Post vital signs: Reviewed and stable  Last Vitals:  Vitals Value Taken Time  BP 146/81 11/23/19 0939  Temp    Pulse 111 11/23/19 0943  Resp 15 11/23/19 0943  SpO2 100 % 11/23/19 0943  Vitals shown include unvalidated device data.  Last Pain:  Vitals:   11/23/19 0703  TempSrc: Oral  PainSc: 3          Complications: No apparent anesthesia complications

## 2019-11-24 ENCOUNTER — Encounter: Payer: BC Managed Care – PPO | Admitting: Physical Medicine and Rehabilitation

## 2019-11-24 ENCOUNTER — Telehealth: Payer: Self-pay | Admitting: Orthopaedic Surgery

## 2019-11-24 NOTE — Telephone Encounter (Signed)
Patient's wife Solmon Ice called requesting husband hydrocodone medication be switched to tramadol. Hydrocodone is making patient sick. Patient's wife states patient has taken tramadol in the past and did not make him sick. Patient's wife is requesting to call in new medication. Patient's wife requesting medication be sent to pharmacy on file. Patient phone number is [phone].

## 2019-11-24 NOTE — Telephone Encounter (Signed)
Duplicate Msg.

## 2019-11-24 NOTE — Telephone Encounter (Signed)
Patient's wife called. Says that the hydrocodone is making him sick. Would like to know if there is anything else he can take. Her call back number is [phone].

## 2019-11-25 ENCOUNTER — Encounter: Payer: Self-pay | Admitting: *Deleted

## 2019-11-25 ENCOUNTER — Other Ambulatory Visit: Payer: Self-pay | Admitting: Physician Assistant

## 2019-11-25 LAB — SURGICAL PATHOLOGY

## 2019-11-25 MED ORDER — TRAMADOL HCL 50 MG PO TABS: 50.0000 mg | ORAL_TABLET | Freq: Three times a day (TID) | ORAL | 0 refills | Status: DC | PRN

## 2019-11-25 NOTE — Telephone Encounter (Signed)
I just sent in

## 2019-12-05 ENCOUNTER — Other Ambulatory Visit: Payer: Self-pay

## 2019-12-05 ENCOUNTER — Ambulatory Visit: Payer: Self-pay

## 2019-12-05 ENCOUNTER — Encounter: Payer: Self-pay | Admitting: Physical Medicine and Rehabilitation

## 2019-12-05 ENCOUNTER — Ambulatory Visit: Payer: BC Managed Care – PPO | Admitting: Physical Medicine and Rehabilitation

## 2019-12-05 VITALS — BP 150/87 | HR 99 | Ht 72.0 in | Wt 260.0 lb

## 2019-12-05 DIAGNOSIS — M5414 Radiculopathy, thoracic region: Secondary | ICD-10-CM | POA: Diagnosis not present

## 2019-12-05 DIAGNOSIS — M519 Unspecified thoracic, thoracolumbar and lumbosacral intervertebral disc disorder: Secondary | ICD-10-CM | POA: Diagnosis not present

## 2019-12-05 MED ORDER — DEXAMETHASONE SODIUM PHOSPHATE 10 MG/ML IJ SOLN
15.0000 mg | Freq: Once | INTRAMUSCULAR | Status: AC
  Administered 2019-12-05: 10:00:00 15 mg
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
## 2019-12-05 NOTE — Progress Notes (Signed)
Numeric Pain Rating Scale and Functional Assessment Average Pain: 5-6   In the last MONTH (on 0-10 scale) has pain interfered with the following?  1. General activity like being  able to carry out your everyday physical activities such as walking, climbing stairs, carrying groceries, or moving a chair? Yes with carrying groceries or moving a chair Rating(8-9)   **patient states that he does bleed a lot**  +Driver, -BT, -Dye Allergies.

## 2019-12-05 NOTE — Procedures (Signed)
Thoracic Transforaminal Epidural Steroid Injection - Infraneural Approach with Fluoroscopic Guidance  Patient: Kevin Robbins      Date of Birth: Jun 11, 1991 MRN: [id number] PCP: Hoyt Koch, MD      Visit Date: 12/05/2019   Universal Protocol:    Date/Time: 12/04/2110:23 PM  Consent Given By: the patient  Position: prone  Additional Comments: Vital signs were monitored before and after the procedure. Patient was prepped and draped in the usual sterile fashion. The correct patient, procedure, and site was verified.   Injection Procedure Details:  Procedure Site One Meds Administered:  Meds ordered this encounter  Medications  . dexamethasone (DECADRON) injection 15 mg     Laterality: Left  Location/Site:  T9-10  Needle size: 25 G  Needle type: spinal needle  Needle Placement: under the pedicle  Findings:  -Contrast Used: 1 mL iohexol 180 mg iodine/mL   -Comments: Excellent flow of contrast along the nerve and into the epidural space.  Procedure Details: After squaring off the end-plates of the desired vertebral level to get a true AP view, the C-arm was obliqued to the painful side so that the superior articulating process is positioned about 1/3 the length inferior endplate.  The needle was aimed toward the junction of the superior articular process and the transverse process of the inferior vertebrae. The needle's initial entry is in the lower third of the foramen or "low in the hole." The soft tissues overlying this target were infiltrated with 2-3 ml. of 1% Lidocaine without Epinephrine.  The spinal needle was then inserted and advanced toward the target using a "trajectory" view along the fluoroscope beam.  Under AP and lateral visualization, the needle was advanced so it did not puncture dura and did not traverse medially beyond the 6 o'clock position of the pedicle. Bi-planar projections were used to confirm position. Aspiration was confirmed to be negative  for CSF and/or blood. A 1-2 ml. volume of Isovue-250 was injected and flow of contrast was noted at each level. Radiographs were obtained for documentation purposes.   After attaining the desired flow of contrast documented above, a 0.5 to 1.0 ml test dose of 0.25% Marcaine was injected into each respective transforaminal space.  The patient was observed for 90 seconds post injection.  After no sensory deficits were reported, and normal lower extremity motor function was noted,   the above injectate was administered so that equal amounts of the injectate were placed at each foramen (level) into the transforaminal epidural space.   Additional Comments:  The patient tolerated the procedure well Dressing: Band-Aid    Post-procedure details: Patient was observed during the procedure. Po st-procedure instructions were reviewed.  Patient left the clinic in stable condition.

## 2019-12-07 ENCOUNTER — Telehealth: Payer: Self-pay | Admitting: Family Medicine

## 2019-12-07 ENCOUNTER — Other Ambulatory Visit: Payer: Self-pay | Admitting: Family Medicine

## 2019-12-07 ENCOUNTER — Other Ambulatory Visit: Payer: Self-pay

## 2019-12-07 ENCOUNTER — Ambulatory Visit (INDEPENDENT_AMBULATORY_CARE_PROVIDER_SITE_OTHER): Payer: BC Managed Care – PPO | Admitting: Physician Assistant

## 2019-12-07 ENCOUNTER — Encounter: Payer: Self-pay | Admitting: Orthopaedic Surgery

## 2019-12-07 DIAGNOSIS — D171 Benign lipomatous neoplasm of skin and subcutaneous tissue of trunk: Secondary | ICD-10-CM

## 2019-12-07 MED ORDER — TRAMADOL HCL 50 MG PO TABS: 50.0000 mg | ORAL_TABLET | Freq: Three times a day (TID) | ORAL | 0 refills | Status: DC | PRN

## 2019-12-07 NOTE — Telephone Encounter (Signed)
I called and advised the patient. 

## 2019-12-07 NOTE — Progress Notes (Signed)
   Post-Op Visit Note   Patient: Kevin Robbins           Date of Birth: 1991-04-03           MRN: [id number] Visit Date: 12/07/2019 PCP: Hoyt Koch, MD   Assessment & Plan:  Chief Complaint: No chief complaint on file.  Visit Diagnoses:  1. Lipoma of back     Plan: Patient is a pleasant 29 year old gentleman who comes in today 2 weeks out excision subcutaneous lipoma right scapular region.  Date of surgery 12/05/2019.  He has been doing well.  No fevers or chills.  Minimal pain.  Examination of his wound reveals a well-healing surgical incision with nylon sutures in place.  No evidence of infection, cellulitis or seroma.  He is neurovascularly intact distally.  Today, nylon sutures were removed and Steri-Strips applied.  I recommended no heavy lifting or soaking the wound for another 2 weeks.  We will provide him with a work note to be out for another 2 weeks as he often has to lift and climb ladders.  Follow-up with [place] in 4 weeks time for recheck.  Call with concerns or questions.  Follow-Up Instructions: Return in about 4 weeks (around 01/04/2020).   Orders:  No orders of the defined types were placed in this encounter.  No orders of the defined types were placed in this encounter.   Imaging: No new imaging  PMFS History: Patient Active Problem List   Diagnosis Date Noted  . Lipoma of back 11/08/2019  . Lipoma of right shoulder 11/08/2019  . Acute neck pain 06/20/2019  . Acute back pain 10/22/2018  . Routine general medical examination at a health care facility 08/02/2018  . Thrombocytopenia ([place]) 12/20/2014   Past Medical History:  Diagnosis Date  . Von Willebrand disease ([place])     Family History  Problem Relation Age of Onset  . Drug abuse Other   . Von Willebrand disease Other     Past Surgical History:  Procedure Laterality Date  . KNEE SURGERY Left 2017  . KNEE SURGERY Right 2018  . LIPOMA EXCISION Right 11/23/2019   Procedure: RIGHT SHOUDLER LIPOMA  REMOVAL;  Surgeon: Leandrew Koyanagi, MD;  Location: [facility];  Service: Orthopedics;  Laterality: Right;  . lipoma removal  N/A 2018   Social History   Occupational History  . Not on file  Tobacco Use  . Smoking status: Never Smoker  . Smokeless tobacco: Never Used  Substance and Sexual Activity  . Alcohol use: No  . Drug use: No  . Sexual activity: Not on file

## 2019-12-07 NOTE — Telephone Encounter (Signed)
Rx sent 

## 2019-12-07 NOTE — Telephone Encounter (Signed)
He is seeing Dr. Erlinda Hong for a postop visit today. Which of you are filling his pain medication now? Mendel Ryder filled it last time.

## 2019-12-07 NOTE — Telephone Encounter (Signed)
Patient called.   He need a refill of his tramadol.   Call back number: [phone]

## 2019-12-12 DIAGNOSIS — Z79899 Other long term (current) drug therapy: Secondary | ICD-10-CM | POA: Diagnosis not present

## 2019-12-12 DIAGNOSIS — F902 Attention-deficit hyperactivity disorder, combined type: Secondary | ICD-10-CM | POA: Diagnosis not present

## 2019-12-15 ENCOUNTER — Telehealth: Payer: Self-pay | Admitting: Family Medicine

## 2019-12-15 ENCOUNTER — Other Ambulatory Visit: Payer: Self-pay | Admitting: Family Medicine

## 2019-12-15 DIAGNOSIS — M546 Pain in thoracic spine: Secondary | ICD-10-CM

## 2019-12-15 DIAGNOSIS — M542 Cervicalgia: Secondary | ICD-10-CM

## 2019-12-15 MED ORDER — TRAMADOL HCL 50 MG PO TABS: 50.0000 mg | ORAL_TABLET | Freq: Two times a day (BID) | ORAL | 0 refills | Status: DC | PRN

## 2019-12-15 MED ORDER — GABAPENTIN 100 MG PO CAPS: ORAL_CAPSULE | ORAL | 3 refills | Status: AC

## 2019-12-15 NOTE — Progress Notes (Signed)
Will try to decrease pain med intake.  Changing tramadol to BID prn, and adding gabapentin.  If still no improvement, then pain clinic referral.

## 2019-12-15 NOTE — Telephone Encounter (Signed)
Please advise 

## 2019-12-15 NOTE — Telephone Encounter (Signed)
I called the patient. He said he has tried Gabapentin before and it made him feel depressed. He is hoping that the [facility] he had will "kick in" soon. The patient would like to start the process of a pain clinic referral, though.

## 2019-12-15 NOTE — Telephone Encounter (Signed)
At this point, he really shouldn't still need pain medication like tramadol.  I'm calling in 60 tablets to take 1 twice daily as needed, and will add gabapentin.  If this still doesn't ease his pain, then I'll refer him to a pain clinic.

## 2019-12-15 NOTE — Telephone Encounter (Signed)
Patient called requesting a refill of Tramadol. Patient states need tramadol 30 day supply takne 3 times a day not the 5 day supply taken 6 times a day.Patient asked for call back to clarify that correct dosage to be sent to pharmacy. Please send to pharmacy on file. Patient phone number is [phone].

## 2019-12-16 NOTE — Telephone Encounter (Signed)
Pain clinic referral made.  °

## 2020-01-03 ENCOUNTER — Ambulatory Visit: Payer: Self-pay

## 2020-01-03 ENCOUNTER — Encounter: Payer: Self-pay | Admitting: Orthopaedic Surgery

## 2020-01-03 ENCOUNTER — Other Ambulatory Visit: Payer: Self-pay

## 2020-01-03 ENCOUNTER — Telehealth: Payer: Self-pay | Admitting: Family Medicine

## 2020-01-03 ENCOUNTER — Ambulatory Visit (INDEPENDENT_AMBULATORY_CARE_PROVIDER_SITE_OTHER): Payer: BC Managed Care – PPO | Admitting: Orthopaedic Surgery

## 2020-01-03 DIAGNOSIS — M25562 Pain in left knee: Secondary | ICD-10-CM | POA: Diagnosis not present

## 2020-01-03 DIAGNOSIS — M25561 Pain in right knee: Secondary | ICD-10-CM

## 2020-01-03 DIAGNOSIS — D171 Benign lipomatous neoplasm of skin and subcutaneous tissue of trunk: Secondary | ICD-10-CM

## 2020-01-03 DIAGNOSIS — G8929 Other chronic pain: Secondary | ICD-10-CM | POA: Diagnosis not present

## 2020-01-03 MED ORDER — LIDOCAINE HCL 1 % IJ SOLN
3.0000 mL | INTRAMUSCULAR | Status: AC | PRN
  Administered 2020-01-03: 3 mL

## 2020-01-03 MED ORDER — BUPIVACAINE HCL 0.25 % IJ SOLN
0.6600 mL | INTRAMUSCULAR | Status: AC | PRN
  Administered 2020-01-03: .66 mL via INTRA_ARTICULAR

## 2020-01-03 MED ORDER — LIDOCAINE HCL 1 % IJ SOLN
3.0000 mL | INTRAMUSCULAR | Status: AC | PRN
  Administered 2020-01-03: 15:00:00 3 mL

## 2020-01-03 MED ORDER — TRAMADOL HCL 50 MG PO TABS: 50.0000 mg | ORAL_TABLET | Freq: Two times a day (BID) | ORAL | 0 refills | Status: AC | PRN

## 2020-01-03 MED ORDER — METHYLPREDNISOLONE ACETATE 40 MG/ML IJ SUSP
13.3300 mg | INTRAMUSCULAR | Status: AC | PRN
  Administered 2020-01-03: 13.33 mg via INTRA_ARTICULAR

## 2020-01-03 NOTE — Telephone Encounter (Signed)
Patient called. He would like a refill on Tramadol. His call back number is [phone]

## 2020-01-03 NOTE — Telephone Encounter (Signed)
Left message on the patient's voice mail that the medication was sent in to his pharmacy.

## 2020-01-03 NOTE — Addendum Note (Signed)
Addended by: Hortencia Pilar on: 01/03/2020 04:20 PM   Modules accepted: Orders

## 2020-01-03 NOTE — Telephone Encounter (Signed)
Sent!

## 2020-01-03 NOTE — Telephone Encounter (Signed)
Please advise. His pain management referral was sent to [facility] on 12/20/19--Sabrina is going to check on the progress of this.

## 2020-01-03 NOTE — Progress Notes (Signed)
Office Visit Note   Patient: Kevin Robbins           Date of Birth: 1991-07-10           MRN: [id number] Visit Date: 01/03/2020              Requested by: Kevin Koch, MD [address] [place],  [place] [address] PCP: Kevin Koch, MD   Assessment & Plan: Visit Diagnoses:  1. Chronic pain of both knees   2. Lipoma of back     Plan: Impression is 6 weeks status post excision right scapular lipoma and bilateral knee arthritis flareup.  In regards to his lipoma excision, he has healed nicely.  He will continue to advance with activity as tolerated.  Follow-up with [place] as needed.  In regards to his bilateral knee pain, we injected both with cortisone today.  He will follow up with [place] as needed.  Call with concerns or questions.  Follow-Up Instructions: Return if symptoms worsen or fail to improve.   Orders:  Orders Placed This Encounter  Procedures  . Large Joint Inj: bilateral knee  . XR KNEE 3 VIEW LEFT  . XR KNEE 3 VIEW RIGHT   No orders of the defined types were placed in this encounter.     Procedures: Large Joint Inj: bilateral knee on 01/03/2020 3:26 PM Indications: pain Details: 22 G needle, anterolateral approach Medications (Right): 0.66 mL bupivacaine 0.25 %; 3 mL lidocaine 1 %; 13.33 mg methylPREDNISolone acetate 40 MG/ML Medications (Left): 0.66 mL bupivacaine 0.25 %; 3 mL lidocaine 1 %; 13.33 mg methylPREDNISolone acetate 40 MG/ML      Clinical Data: No additional findings.   Subjective: Chief Complaint  Patient presents with  . Right Knee - Pain  . Left Knee - Pain  . Lipoma    right scapular area    HPI patient is a pleasant 29 year old gentleman who comes in today 6 weeks out excision right scapular lipoma.  He has been doing well.  No complications and no pain.  He has returned to work full duty without issues.  Other issue he brings up today is bilateral knee pain left greater than right.  His pain began approximately 2 weeks  ago and has worsened over the past 4 days.  No injury or change in activity.  The pain he has is to the anterior aspect.  Worse with squatting as well as going from a seated to standing position.  He has been taking over-the-counter medications without relief of symptoms.  He does note a previous history of chondromalacia patella with former bilateral knee arthroscopies by Dr. French Robbins in 2015.  He has had 3-4 cortisone injections in each knee since with several months relief each.  There has been a few years since his last injections.  He has in the past tried viscosupplementation injections without relief of symptoms.  Review of Systems as detailed in HPI.  All others reviewed and are negative.   Objective: Vital Signs: There were no vitals taken for this visit.  Physical Exam well-developed well-nourished gentleman in no acute distress.  Alert and oriented x3.  Ortho Exam examination of his back reveals a fully healed surgical scar without complication.  Examination of both knees reveals no effusion.  Range of motion 0 to 110 degrees.  No joint line tenderness.  Mild patella tenderness.  Ligaments are stable.  He is neurovascular intact distally.  Specialty Comments:  No specialty comments available.  Imaging: XR  KNEE 3 VIEW LEFT  Result Date: 01/03/2020 Mild to moderate joint space narrowing medial and patellofemoral compartments  XR KNEE 3 VIEW RIGHT  Result Date: 01/03/2020 Mild to moderate joint space narrowing medial and patellofemoral compartments    PMFS History: Patient Active Problem List   Diagnosis Date Noted  . Lipoma of back 11/08/2019  . Lipoma of right shoulder 11/08/2019  . Acute neck pain 06/20/2019  . Acute back pain 10/22/2018  . Routine general medical examination at a health care facility 08/02/2018  . Thrombocytopenia (Kevin Robbins) 12/20/2014   Past Medical History:  Diagnosis Date  . Von Willebrand disease (Kevin Robbins)     Family History  Problem Relation Age of  Onset  . Drug abuse Other   . Von Willebrand disease Other     Past Surgical History:  Procedure Laterality Date  . KNEE SURGERY Left 2017  . KNEE SURGERY Right 2018  . LIPOMA EXCISION Right 11/23/2019   Procedure: RIGHT SHOUDLER LIPOMA REMOVAL;  Surgeon: Kevin Koyanagi, MD;  Location: [facility];  Service: Orthopedics;  Laterality: Right;  . lipoma removal  N/A 2018   Social History   Occupational History  . Not on file  Tobacco Use  . Smoking status: Never Smoker  . Smokeless tobacco: Never Used  Substance and Sexual Activity  . Alcohol use: No  . Drug use: No  . Sexual activity: Not on file

## 2020-01-04 ENCOUNTER — Ambulatory Visit: Payer: BC Managed Care – PPO | Admitting: Orthopaedic Surgery

## 2020-01-09 DIAGNOSIS — M76821 Posterior tibial tendinitis, right leg: Secondary | ICD-10-CM | POA: Diagnosis not present

## 2020-01-09 DIAGNOSIS — M19071 Primary osteoarthritis, right ankle and foot: Secondary | ICD-10-CM | POA: Diagnosis not present

## 2020-01-13 DIAGNOSIS — M546 Pain in thoracic spine: Secondary | ICD-10-CM | POA: Diagnosis not present

## 2020-01-13 DIAGNOSIS — Z79899 Other long term (current) drug therapy: Secondary | ICD-10-CM | POA: Diagnosis not present

## 2020-01-13 DIAGNOSIS — Z20822 Contact with and (suspected) exposure to covid-19: Secondary | ICD-10-CM | POA: Diagnosis not present

## 2020-01-13 DIAGNOSIS — E559 Vitamin D deficiency, unspecified: Secondary | ICD-10-CM | POA: Diagnosis not present

## 2020-01-13 DIAGNOSIS — M545 Low back pain: Secondary | ICD-10-CM | POA: Diagnosis not present

## 2020-01-13 DIAGNOSIS — M129 Arthropathy, unspecified: Secondary | ICD-10-CM | POA: Diagnosis not present

## 2020-01-13 DIAGNOSIS — M542 Cervicalgia: Secondary | ICD-10-CM | POA: Diagnosis not present

## 2020-01-20 DIAGNOSIS — M76821 Posterior tibial tendinitis, right leg: Secondary | ICD-10-CM | POA: Diagnosis not present

## 2020-01-27 DIAGNOSIS — M76821 Posterior tibial tendinitis, right leg: Secondary | ICD-10-CM | POA: Diagnosis not present

## 2020-02-10 DIAGNOSIS — M545 Low back pain: Secondary | ICD-10-CM | POA: Diagnosis not present

## 2020-02-10 DIAGNOSIS — Z79899 Other long term (current) drug therapy: Secondary | ICD-10-CM | POA: Diagnosis not present

## 2020-02-10 DIAGNOSIS — E559 Vitamin D deficiency, unspecified: Secondary | ICD-10-CM | POA: Diagnosis not present

## 2020-02-14 DIAGNOSIS — E559 Vitamin D deficiency, unspecified: Secondary | ICD-10-CM | POA: Diagnosis not present

## 2020-02-14 DIAGNOSIS — M545 Low back pain: Secondary | ICD-10-CM | POA: Diagnosis not present

## 2020-03-21 DIAGNOSIS — Z79899 Other long term (current) drug therapy: Secondary | ICD-10-CM | POA: Diagnosis not present

## 2020-03-21 DIAGNOSIS — E559 Vitamin D deficiency, unspecified: Secondary | ICD-10-CM | POA: Diagnosis not present

## 2020-03-21 DIAGNOSIS — M546 Pain in thoracic spine: Secondary | ICD-10-CM | POA: Diagnosis not present

## 2020-04-20 DIAGNOSIS — M546 Pain in thoracic spine: Secondary | ICD-10-CM | POA: Diagnosis not present

## 2020-04-20 DIAGNOSIS — Z6836 Body mass index (BMI) 36.0-36.9, adult: Secondary | ICD-10-CM | POA: Diagnosis not present

## 2020-04-20 DIAGNOSIS — Z79899 Other long term (current) drug therapy: Secondary | ICD-10-CM | POA: Diagnosis not present

## 2020-05-18 DIAGNOSIS — Z79899 Other long term (current) drug therapy: Secondary | ICD-10-CM | POA: Diagnosis not present

## 2020-05-18 DIAGNOSIS — M546 Pain in thoracic spine: Secondary | ICD-10-CM | POA: Diagnosis not present

## 2020-05-18 DIAGNOSIS — E559 Vitamin D deficiency, unspecified: Secondary | ICD-10-CM | POA: Diagnosis not present

## 2020-06-18 DIAGNOSIS — F5104 Psychophysiologic insomnia: Secondary | ICD-10-CM | POA: Diagnosis not present

## 2020-06-18 DIAGNOSIS — M546 Pain in thoracic spine: Secondary | ICD-10-CM | POA: Diagnosis not present

## 2020-06-18 DIAGNOSIS — Z79899 Other long term (current) drug therapy: Secondary | ICD-10-CM | POA: Diagnosis not present

## 2020-06-26 ENCOUNTER — Ambulatory Visit: Payer: Self-pay

## 2020-06-26 ENCOUNTER — Ambulatory Visit (INDEPENDENT_AMBULATORY_CARE_PROVIDER_SITE_OTHER): Payer: BC Managed Care – PPO | Admitting: Orthopaedic Surgery

## 2020-06-26 ENCOUNTER — Encounter: Payer: Self-pay | Admitting: Orthopaedic Surgery

## 2020-06-26 VITALS — Ht 72.0 in | Wt 274.0 lb

## 2020-06-26 DIAGNOSIS — M170 Bilateral primary osteoarthritis of knee: Secondary | ICD-10-CM | POA: Diagnosis not present

## 2020-06-26 NOTE — Progress Notes (Signed)
Office Visit Note   Patient: Kevin Robbins           Date of Birth: 1991-08-01           MRN: [id number] Visit Date: 06/26/2020              Requested by: Hoyt Koch, MD [address] [place],  [place] [address] PCP: Hoyt Koch, MD   Assessment & Plan: Visit Diagnoses:  1. Bilateral primary osteoarthritis of knee     Plan: Impression is bilateral knee chondromalacia patella.  At this point, the patient no longer benefits from substantial longstanding relief from cortisone injections and has failed viscosupplementation injections in the past.  We will order an MRI of both knees to further assess his chondromalacia.  He will follow up with [place] once those have been completed.  Follow-Up Instructions: Return for after MRI.   Orders:  Orders Placed This Encounter  Procedures  . MR Knee Right w/o contrast  . MR Knee Left w/o contrast   No orders of the defined types were placed in this encounter.     Procedures: No procedures performed   Clinical Data: No additional findings.   Subjective: Chief Complaint  Patient presents with  . Right Knee - Pain  . Left Knee - Pain    HPI patient is a pleasant 29 year old gentleman who comes in today with recurrent bilateral knee pain left greater than right.  History of bilateral chondromalacia patella for which she had a debriding arthroscopy by Dr. French Ana in 2015.  He was last seen by [place] in February 2021 where both knees were injected with cortisone.  He had moderate relief of symptoms but only for about 2 months.  He has tried viscosupplementation injections in the past without relief of symptoms.  The pain he has is to the retropatella both sides.  Pain is worse when he is going down stairs or ladder.  He has been taking over-the-counter medications without relief of symptoms.  Review of Systems as detailed in HPI.  All others reviewed and are negative.   Objective: Vital Signs: Ht 6' (1.829 m)   Wt 274 lb  (124.3 kg)   BMI 37.16 kg/m   Physical Exam well-developed well-nourished gentleman in no acute distress.  Alert and oriented x3.  Ortho Exam examination of both knees shows range of motion from 0 to 120 degrees.  No joint line tenderness.  Moderate patellofemoral crepitus both sides.  He has 5/5 strength with resisted straight leg raise.  Ligaments are stable.  Is neurovascular intact distally.  Specialty Comments:  No specialty comments available.  Imaging: No new imaging   PMFS History: Patient Active Problem List   Diagnosis Date Noted  . Lipoma of back 11/08/2019  . Lipoma of right shoulder 11/08/2019  . Acute neck pain 06/20/2019  . Acute back pain 10/22/2018  . Routine general medical examination at a health care facility 08/02/2018  . Thrombocytopenia ([facility]) 12/20/2014   Past Medical History:  Diagnosis Date  . Von Willebrand disease ([place])     Family History  Problem Relation Age of Onset  . Drug abuse Other   . Von Willebrand disease Other     Past Surgical History:  Procedure Laterality Date  . KNEE SURGERY Left 2017  . KNEE SURGERY Right 2018  . LIPOMA EXCISION Right 11/23/2019   Procedure: RIGHT SHOUDLER LIPOMA REMOVAL;  Surgeon: Leandrew Koyanagi, MD;  Location: [facility];  Service: Orthopedics;  Laterality: Right;  . lipoma removal  N/A 2018   Social History   Occupational History  . Not on file  Tobacco Use  . Smoking status: Never Smoker  . Smokeless tobacco: Never Used  Vaping Use  . Vaping Use: Every day  . Substances: Nicotine  Substance and Sexual Activity  . Alcohol use: No  . Drug use: No  . Sexual activity: Not on file

## 2020-07-18 DIAGNOSIS — Z79899 Other long term (current) drug therapy: Secondary | ICD-10-CM | POA: Diagnosis not present

## 2020-07-18 DIAGNOSIS — M546 Pain in thoracic spine: Secondary | ICD-10-CM | POA: Diagnosis not present

## 2020-07-18 DIAGNOSIS — F5104 Psychophysiologic insomnia: Secondary | ICD-10-CM | POA: Diagnosis not present

## 2020-07-20 DIAGNOSIS — F902 Attention-deficit hyperactivity disorder, combined type: Secondary | ICD-10-CM | POA: Diagnosis not present

## 2020-07-20 DIAGNOSIS — Z79899 Other long term (current) drug therapy: Secondary | ICD-10-CM | POA: Diagnosis not present

## 2020-07-30 ENCOUNTER — Other Ambulatory Visit: Payer: Self-pay | Admitting: Orthopaedic Surgery

## 2020-07-30 ENCOUNTER — Other Ambulatory Visit: Payer: BC Managed Care – PPO

## 2020-07-30 DIAGNOSIS — M170 Bilateral primary osteoarthritis of knee: Secondary | ICD-10-CM

## 2020-08-01 ENCOUNTER — Ambulatory Visit: Payer: BC Managed Care – PPO | Admitting: Orthopaedic Surgery

## 2020-08-17 DIAGNOSIS — M546 Pain in thoracic spine: Secondary | ICD-10-CM | POA: Diagnosis not present

## 2020-08-17 DIAGNOSIS — D691 Qualitative platelet defects: Secondary | ICD-10-CM | POA: Diagnosis not present

## 2020-08-17 DIAGNOSIS — Z79899 Other long term (current) drug therapy: Secondary | ICD-10-CM | POA: Diagnosis not present

## 2020-08-17 DIAGNOSIS — M542 Cervicalgia: Secondary | ICD-10-CM | POA: Diagnosis not present

## 2020-08-27 ENCOUNTER — Ambulatory Visit
Admission: RE | Admit: 2020-08-27 | Discharge: 2020-08-27 | Disposition: A | Discharge status: Home / Self Care | Payer: BC Managed Care – PPO | Source: Ambulatory Visit | Attending: Orthopaedic Surgery | Admitting: Orthopaedic Surgery

## 2020-08-27 ENCOUNTER — Other Ambulatory Visit: Payer: Self-pay

## 2020-08-27 DIAGNOSIS — M25562 Pain in left knee: Secondary | ICD-10-CM | POA: Diagnosis not present

## 2020-08-27 DIAGNOSIS — M170 Bilateral primary osteoarthritis of knee: Secondary | ICD-10-CM

## 2020-08-28 NOTE — Progress Notes (Signed)
Needs f/u appt 

## 2020-09-04 ENCOUNTER — Ambulatory Visit: Payer: BC Managed Care – PPO | Admitting: Orthopaedic Surgery

## 2020-09-06 ENCOUNTER — Ambulatory Visit (INDEPENDENT_AMBULATORY_CARE_PROVIDER_SITE_OTHER): Payer: BC Managed Care – PPO | Admitting: Orthopaedic Surgery

## 2020-09-06 ENCOUNTER — Other Ambulatory Visit: Payer: Self-pay

## 2020-09-06 DIAGNOSIS — M170 Bilateral primary osteoarthritis of knee: Secondary | ICD-10-CM

## 2020-09-06 MED ORDER — PENNSAID 2 % EX SOLN: 2.0000 g | Freq: Two times a day (BID) | CUTANEOUS | 3 refills | Status: AC | PRN

## 2020-09-06 NOTE — Progress Notes (Signed)
Office Visit Note   Patient: Kevin Robbins           Date of Birth: 24-Jul-1991           MRN: [id number] Visit Date: 09/06/2020              Requested by: Hoyt Koch, MD [address] [place],  [place] [address] PCP: Hoyt Koch, MD   Assessment & Plan: Visit Diagnoses:  1. Bilateral primary osteoarthritis of knee     Plan: Overall he is feeling much better since he has lost the weight.  I recommended going back to physical therapy for strengthening.  I refilled his prescription for Pennsaid as this has been very effective in the past.  He also has option of doing Visco injections again if he wishes to in the future.  He will also see his rheumatologist about treatment for his ankylosing spondylitis.  Follow-Up Instructions: No follow-ups on file.   Orders:  Orders Placed This Encounter  Procedures   Ambulatory referral to Physical Therapy   Meds ordered this encounter  Medications   Diclofenac Sodium (PENNSAID) 2 % SOLN    Sig: Apply 2 g topically 2 (two) times daily as needed (to affected area).    Dispense:  112 g    Refill:  3      Procedures: No procedures performed   Clinical Data: No additional findings.   Subjective: Chief Complaint  Patient presents with   Left Knee - Pain    Kevin Robbins returns today for MRI review of the left knee.  He has lost 52 pounds and he has noticed an improvement in the knee pain.  His job does require a lot of ladder climbing and stairs.  Recently also diagnosed with ankylosing spondylitis.  He has an upcoming appointment with rheumatologist.  He has had Visco injections, cortisone injections, physical therapy in the past without significant relief.   Review of Systems  Constitutional: Negative.   All other systems reviewed and are negative.    Objective: Vital Signs: There were no vitals taken for this visit.  Physical Exam Vitals and nursing note reviewed.  Constitutional:      Appearance: He is  well-developed.  Pulmonary:     Effort: Pulmonary effort is normal.  Abdominal:     Palpations: Abdomen is soft.  Skin:    General: Skin is warm.  Neurological:     Mental Status: He is alert and oriented to person, place, and time.  Psychiatric:        Behavior: Behavior normal.        Thought Content: Thought content normal.        Judgment: Judgment normal.     Ortho Exam Left knee exam is unremarkable.  No joint effusion. Specialty Comments:  No specialty comments available.  Imaging: No results found.   PMFS History: Patient Active Problem List   Diagnosis Date Noted   Lipoma of back 11/08/2019   Lipoma of right shoulder 11/08/2019   Acute neck pain 06/20/2019   Acute back pain 10/22/2018   Routine general medical examination at a health care facility 08/02/2018   Thrombocytopenia ([facility]) 12/20/2014   Past Medical History:  Diagnosis Date   Von Willebrand disease ([facility])     Family History  Problem Relation Age of Onset   Drug abuse Other    Von Willebrand disease Other     Past Surgical History:  Procedure Laterality Date   KNEE SURGERY Left 2017  KNEE SURGERY Right 2018   LIPOMA EXCISION Right 11/23/2019   Procedure: RIGHT SHOUDLER LIPOMA REMOVAL;  Surgeon: Leandrew Koyanagi, MD;  Location: [facility];  Service: Orthopedics;  Laterality: Right;   lipoma removal  N/A 2018   Social History   Occupational History   Not on file  Tobacco Use   Smoking status: Never Smoker   Smokeless tobacco: Never Used  Vaping Use   Vaping Use: Every day   Substances: Nicotine  Substance and Sexual Activity   Alcohol use: No   Drug use: No   Sexual activity: Not on file

## 2020-09-13 DIAGNOSIS — Z79899 Other long term (current) drug therapy: Secondary | ICD-10-CM | POA: Diagnosis not present

## 2020-09-13 DIAGNOSIS — Z6833 Body mass index (BMI) 33.0-33.9, adult: Secondary | ICD-10-CM | POA: Diagnosis not present

## 2020-09-13 DIAGNOSIS — M546 Pain in thoracic spine: Secondary | ICD-10-CM | POA: Diagnosis not present

## 2020-09-17 ENCOUNTER — Ambulatory Visit: Payer: BC Managed Care – PPO | Attending: Orthopaedic Surgery | Admitting: Physical Therapy

## 2020-10-25 DIAGNOSIS — Z3141 Encounter for fertility testing: Secondary | ICD-10-CM | POA: Diagnosis not present

## 2020-10-29 DIAGNOSIS — M546 Pain in thoracic spine: Secondary | ICD-10-CM | POA: Diagnosis not present

## 2020-10-29 DIAGNOSIS — R000 Tachycardia, unspecified: Secondary | ICD-10-CM | POA: Diagnosis not present

## 2020-10-29 DIAGNOSIS — Z6833 Body mass index (BMI) 33.0-33.9, adult: Secondary | ICD-10-CM | POA: Diagnosis not present

## 2020-10-29 DIAGNOSIS — F902 Attention-deficit hyperactivity disorder, combined type: Secondary | ICD-10-CM | POA: Diagnosis not present

## 2020-10-29 DIAGNOSIS — Z79899 Other long term (current) drug therapy: Secondary | ICD-10-CM | POA: Diagnosis not present

## 2020-10-29 DIAGNOSIS — F419 Anxiety disorder, unspecified: Secondary | ICD-10-CM | POA: Diagnosis not present

## 2020-11-05 DIAGNOSIS — R002 Palpitations: Secondary | ICD-10-CM | POA: Diagnosis not present

## 2020-11-14 DIAGNOSIS — R002 Palpitations: Secondary | ICD-10-CM | POA: Diagnosis not present

## 2021-07-04 ENCOUNTER — Telehealth: Payer: Self-pay

## 2021-07-04 NOTE — Telephone Encounter (Signed)
[facility] for release of medical records asking if a fax has been received. Notified them of the correct number to fax to.

## 2021-07-05 ENCOUNTER — Telehealth: Payer: Self-pay | Admitting: Orthopaedic Surgery

## 2021-07-05 NOTE — Telephone Encounter (Signed)
Received email from Laureen Abrahams with [facility] checkiing on records request from 2/26. I emaled her advising that records were mailed on 01/15/21. Advised that if request needs to be re-processed she will need to submit a new request and I will forward to our records copying service to process.

## 2021-08-01 IMAGING — MR MR KNEE*L* W/O CM
4 of 6 series · 23 of 40 positions shown · non-contrast
Comparison: Radiographs 01/03/2020.  MRI 09/09/2016.

CLINICAL DATA: Left knee pain for 4-5 years. Previous arthroscopy
in 9101. No acute injury.

EXAM:
MRI OF THE LEFT KNEE WITHOUT CONTRAST
TECHNIQUE: Multiplanar, multisequence MR imaging of the knee was performed. No
intravenous contrast was administered.

[Series 4: T2 fat-sat · coronal · 4.0mm · 0.59mm/px · 6 of 24 slices shown (1 of 2)]
[im 1/24]
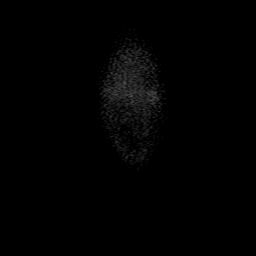
[im 5/24]
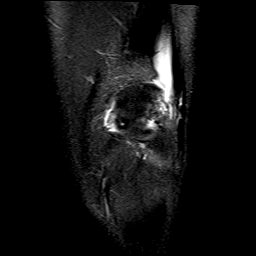
[im 10/24]
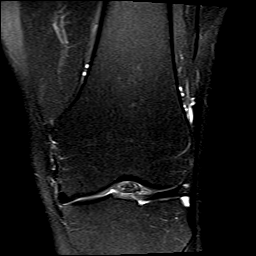
[im 14/24]
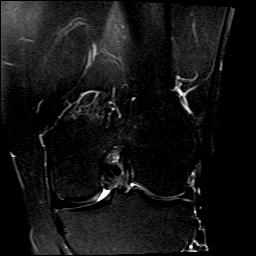
[im 19/24]
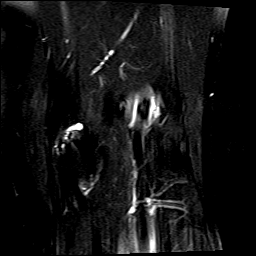
[im 24/24]
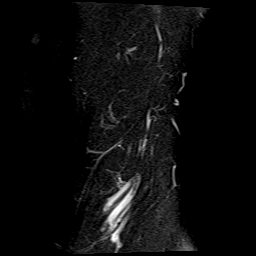

[Series 5: T1 · coronal · 4.0mm · 0.29mm/px · 3 of 24 slices shown]
[im 5/24]
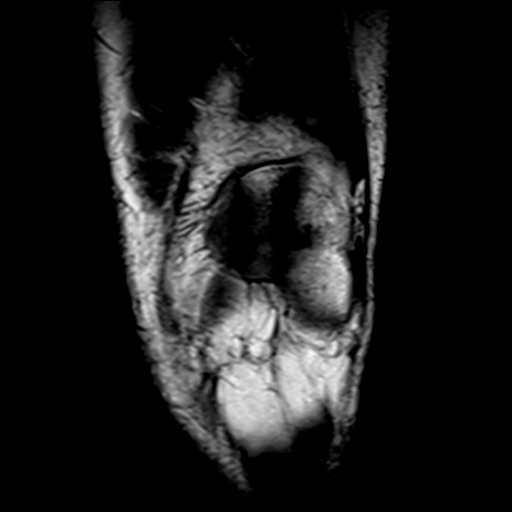
[im 14/24]
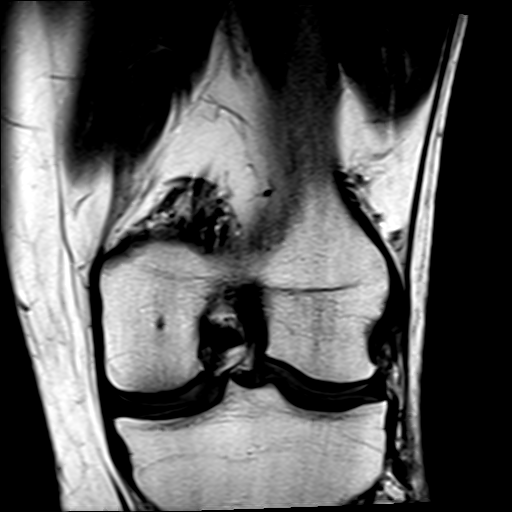
[im 24/24]
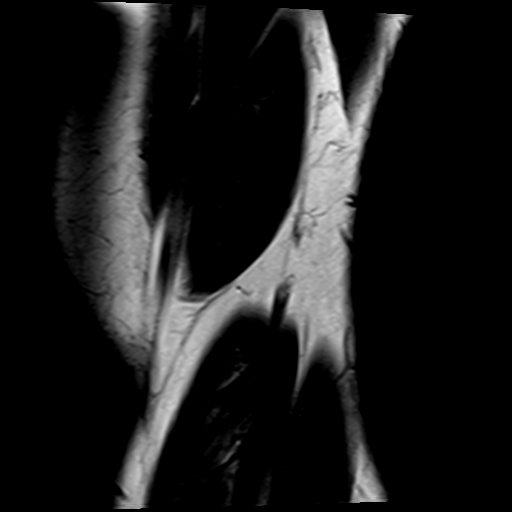

[Series 7: PD fat-sat · sagittal · 3.0mm · 0.29mm/px · 8 of 32 slices shown]
[im 1/32]
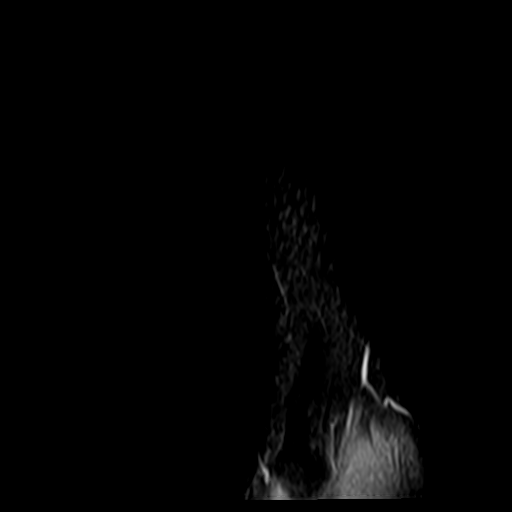
[im 5/32]
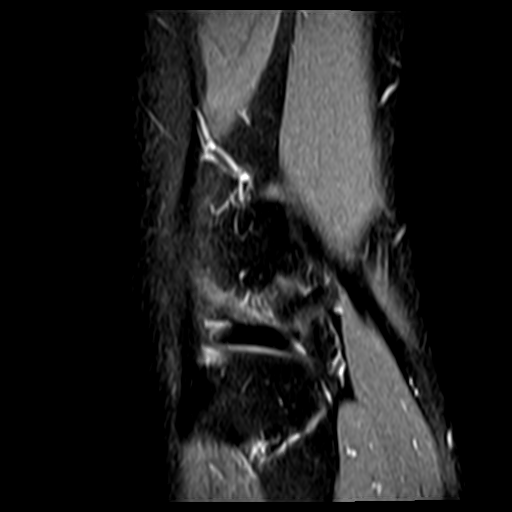
[im 9/32]
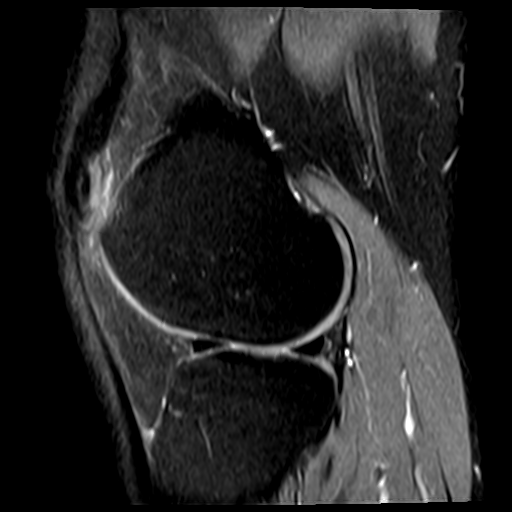
[im 14/32]
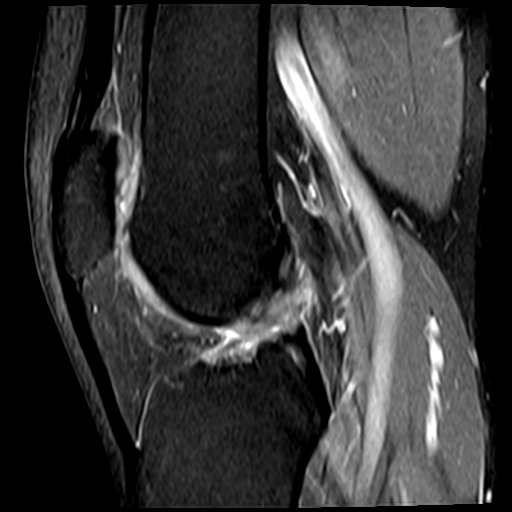
[im 18/32]
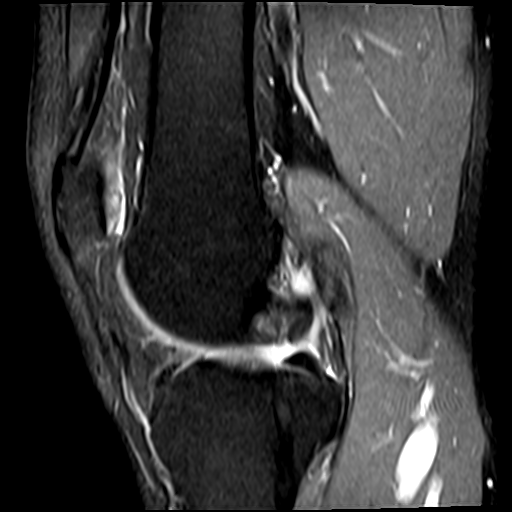
[im 23/32]
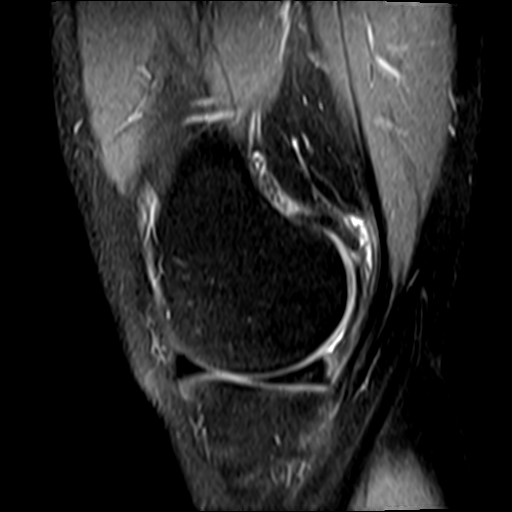
[im 27/32]
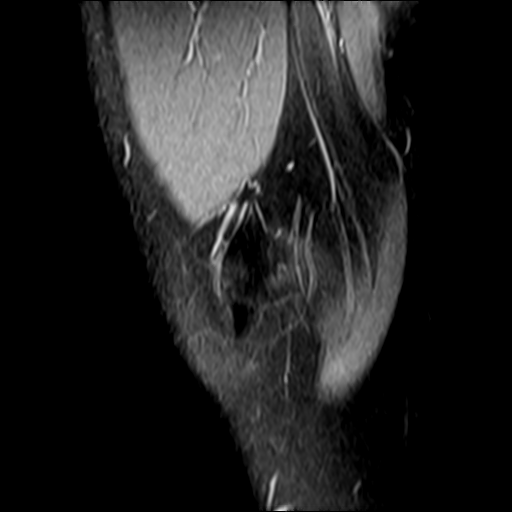
[im 32/32]
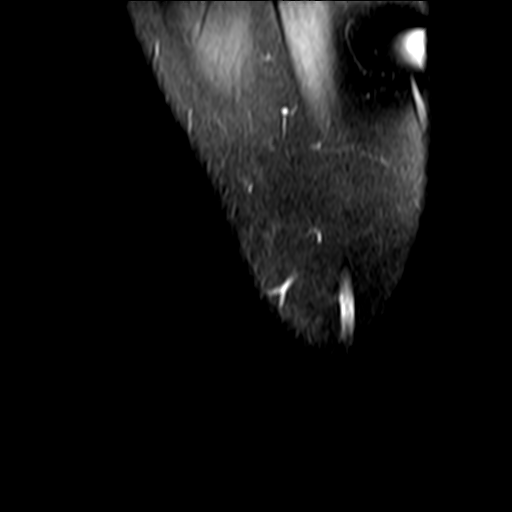

[Series 8: T2 fat-sat · sagittal · 3.0mm · 0.29mm/px · 6 of 32 slices shown (2 of 2)]
[im 1/32]
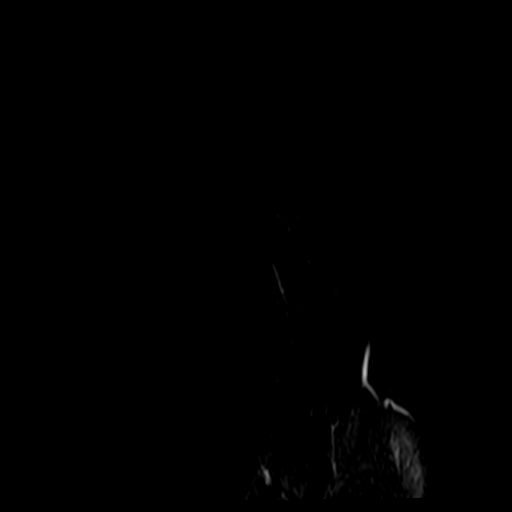
[im 5/32]
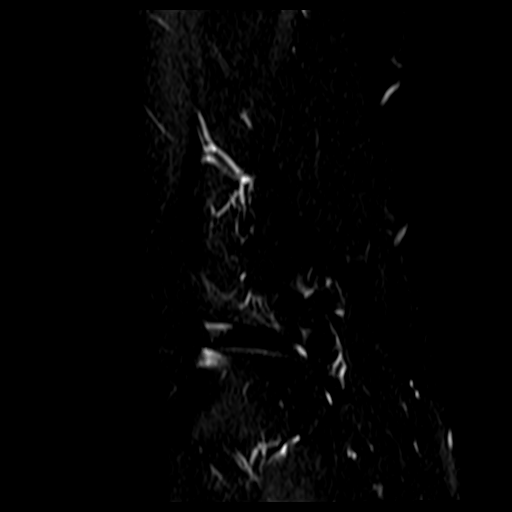
[im 9/32]
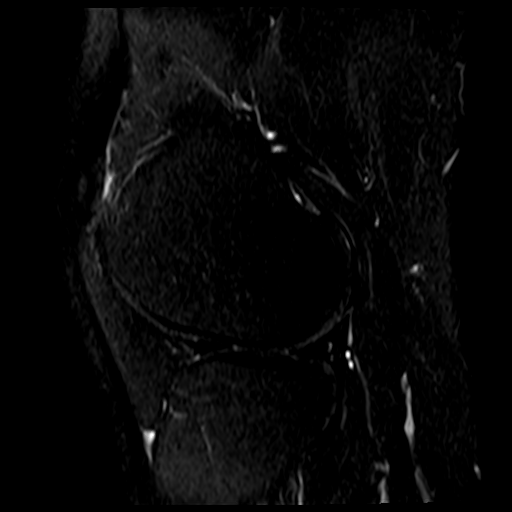
[im 14/32]
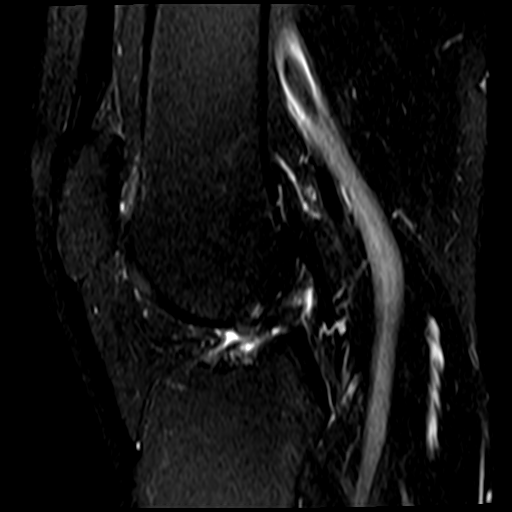
[im 18/32]
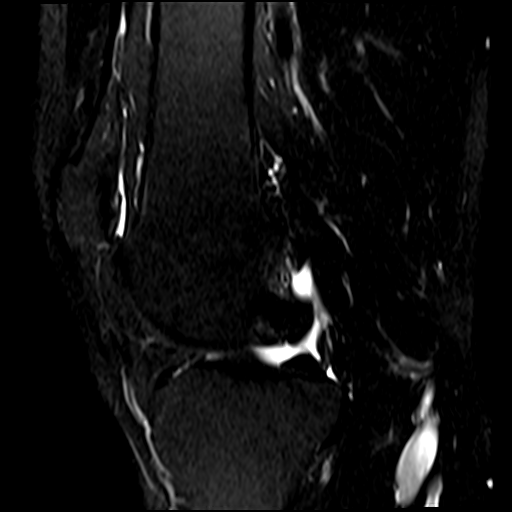
[im 27/32]
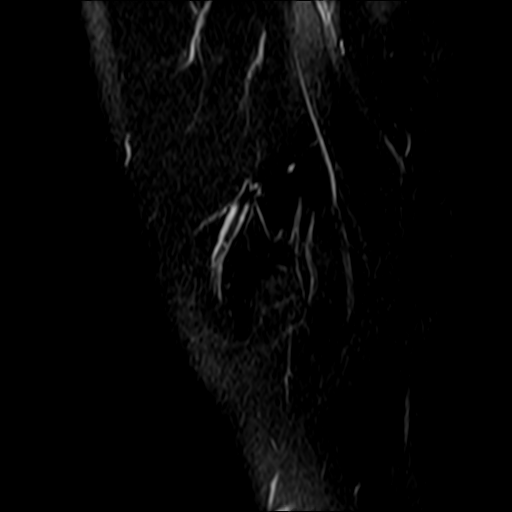

[23 of 40 positions shown; findings below may reference images not displayed]

FINDINGS: MENISCI

Medial meniscus: Intact with normal morphology. Stable grade 1
signal in the posterior horn.

Lateral meniscus:  Intact with normal morphology.

LIGAMENTS

Cruciates:  Intact.

Collaterals:  Intact.

CARTILAGE

Patellofemoral: Mildly progressive patellar chondral thinning and
surface irregularity with minimal subchondral cyst formation in the
lateral facet. The trochlear cartilage is preserved.

Medial:  Preserved.

Lateral:  Preserved.

MISCELLANEOUS

Joint:  No significant joint effusion.

Popliteal Fossa:  Tiny Baker's cyst, similar to previous study.

Extensor Mechanism: Intact. Previously demonstrated edema centrally
in Hoffa's fat has resolved.

Bones:  No acute or significant extra-articular osseous findings.

Other: No other significant periarticular soft tissue findings.
IMPRESSION: 1. No acute findings or evidence of internal derangement. The
menisci, cruciate and collateral ligaments are intact.
2. Mildly progressive patellar chondromalacia.

## 2022-12-18 DIAGNOSIS — Z419 Encounter for procedure for purposes other than remedying health state, unspecified: Secondary | ICD-10-CM | POA: Diagnosis not present

## 2023-01-16 DIAGNOSIS — Z419 Encounter for procedure for purposes other than remedying health state, unspecified: Secondary | ICD-10-CM | POA: Diagnosis not present

## 2023-02-16 DIAGNOSIS — Z419 Encounter for procedure for purposes other than remedying health state, unspecified: Secondary | ICD-10-CM | POA: Diagnosis not present

## 2023-03-18 DIAGNOSIS — Z419 Encounter for procedure for purposes other than remedying health state, unspecified: Secondary | ICD-10-CM | POA: Diagnosis not present

## 2023-04-18 DIAGNOSIS — Z419 Encounter for procedure for purposes other than remedying health state, unspecified: Secondary | ICD-10-CM | POA: Diagnosis not present

## 2023-05-18 DIAGNOSIS — Z419 Encounter for procedure for purposes other than remedying health state, unspecified: Secondary | ICD-10-CM | POA: Diagnosis not present

## 2023-10-02 ENCOUNTER — Other Ambulatory Visit: Payer: Self-pay

## 2023-10-02 ENCOUNTER — Emergency Department (HOSPITAL_COMMUNITY): Payer: BC Managed Care – PPO

## 2023-10-02 ENCOUNTER — Emergency Department (HOSPITAL_COMMUNITY)
Admission: EM | Admit: 2023-10-02 | Discharge: 2023-10-02 | Discharge status: Against Medical Advice | Payer: BC Managed Care – PPO | Attending: Emergency Medicine | Admitting: Emergency Medicine

## 2023-10-02 ENCOUNTER — Encounter (HOSPITAL_COMMUNITY): Payer: Self-pay | Admitting: Emergency Medicine

## 2023-10-02 DIAGNOSIS — Y990 Civilian activity done for income or pay: Secondary | ICD-10-CM | POA: Insufficient documentation

## 2023-10-02 DIAGNOSIS — M25562 Pain in left knee: Secondary | ICD-10-CM | POA: Diagnosis present

## 2023-10-02 DIAGNOSIS — W1830XA Fall on same level, unspecified, initial encounter: Secondary | ICD-10-CM | POA: Insufficient documentation

## 2023-10-02 DIAGNOSIS — Z5321 Procedure and treatment not carried out due to patient leaving prior to being seen by health care provider: Secondary | ICD-10-CM | POA: Diagnosis not present

## 2023-10-02 NOTE — ED Triage Notes (Signed)
Patient arrives ambulatory by POV with crutches c/o falling at work yesterday about 5 pm. C/o pain to left knee.

## 2023-10-06 ENCOUNTER — Encounter: Payer: Self-pay | Admitting: Orthopaedic Surgery

## 2023-10-06 ENCOUNTER — Ambulatory Visit: Payer: BC Managed Care – PPO | Admitting: Orthopaedic Surgery

## 2023-10-06 DIAGNOSIS — S82122A Displaced fracture of lateral condyle of left tibia, initial encounter for closed fracture: Secondary | ICD-10-CM

## 2023-10-06 NOTE — Progress Notes (Signed)
Office Visit Note   Patient: Kevin Robbins           Date of Birth: [birth year]           MRN: [id number] Visit Date: 10/06/2023              Requested by: Myrlene Broker, MD [address] [place],  [place] [address] PCP: Myrlene Broker, MD   Assessment & Plan: Visit Diagnoses:  1. Closed fracture of lateral portion of left tibial plateau, initial encounter     Plan: Kevin Robbins is a 32 year old gentleman with an acute minimally depressed lateral tibial plateau fracture.  Will offload with knee scooter.  38-month handicap placard.  CT scan ASAP to better evaluate fracture.  I will communicate through MyChart after the CT scan on next steps.  Follow-Up Instructions: No follow-ups on file.   Orders:  No orders of the defined types were placed in this encounter.  No orders of the defined types were placed in this encounter.     Procedures: No procedures performed   Clinical Data: No additional findings.   Subjective: Chief Complaint  Patient presents with   Left Knee - Pain    HPI Kevin Robbins is a very pleasant 32 year old gentleman who comes in for evaluation of acute injury to the left knee from about 5 days ago.  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  Feels tightness in the knee.  He is currently using crutches.  His wife is with him.  Review of Systems  Constitutional: Negative.   HENT: Negative.    Eyes: Negative.   Respiratory: Negative.    Cardiovascular: Negative.   Gastrointestinal: Negative.   Endocrine: Negative.   Genitourinary: Negative.   Skin: Negative.   Allergic/Immunologic: Negative.   Neurological: Negative.   Hematological: Negative.   Psychiatric/Behavioral: Negative.    All other systems reviewed and are negative.    Objective: Vital Signs: There were no vitals taken for this visit.  Physical Exam Vitals and nursing note reviewed.  Constitutional:      Appearance:  He is well-developed.  HENT:     Head: Normocephalic and atraumatic.  Eyes:     Pupils: Pupils are equal, round, and reactive to light.  Pulmonary:     Effort: Pulmonary effort is normal.  Abdominal:     Palpations: Abdomen is soft.  Musculoskeletal:        General: Normal range of motion.     Cervical back: Neck supple.  Skin:    General: Skin is warm.  Neurological:     Mental Status: He is alert and oriented to person, place, and time.  Psychiatric:        Behavior: Behavior normal.        Thought Content: Thought content normal.        Judgment: Judgment normal.     Ortho Exam Exam of the left knee shows moderate joint effusion.  Slight tenderness to the lateral tibial plateau.  Collaterals or cruciates are intact. Specialty Comments:  No specialty comments available.  Imaging: No results found.   PMFS History: Patient Active Problem List   Diagnosis Date Noted   Lipoma of back 11/08/2019   Lipoma of right shoulder 11/08/2019   Acute neck pain 06/20/2019   Acute back pain 10/22/2018   Routine general medical examination at a health care facility 08/02/2018   Thrombocytopenia ([facility]) 12/20/2014  Past Medical History:  Diagnosis Date   Von Willebrand disease ([facility])     Family History  Problem Relation Age of Onset   Drug abuse Other    Von Willebrand disease Other     Past Surgical History:  Procedure Laterality Date   KNEE SURGERY Left 2017   KNEE SURGERY Right 2018   LIPOMA EXCISION Right 11/23/2019   Procedure: RIGHT SHOUDLER LIPOMA REMOVAL;  Surgeon: Tarry Kos, MD;  Location: [facility];  Service: Orthopedics;  Laterality: Right;   lipoma removal  N/A 2018   Social History   Occupational History   Not on file  Tobacco Use   Smoking status: Never   Smokeless tobacco: Never  Vaping Use   Vaping status: Every Day   Substances: Nicotine  Substance and Sexual Activity   Alcohol use: No   Drug use: No   Sexual activity: Not on  file

## 2023-10-08 ENCOUNTER — Ambulatory Visit
Admission: RE | Admit: 2023-10-08 | Discharge: 2023-10-08 | Disposition: A | Discharge status: Home / Self Care | Payer: BC Managed Care – PPO | Source: Ambulatory Visit | Attending: Orthopaedic Surgery | Admitting: Orthopaedic Surgery

## 2023-10-08 DIAGNOSIS — S82122A Displaced fracture of lateral condyle of left tibia, initial encounter for closed fracture: Secondary | ICD-10-CM

## 2023-11-03 ENCOUNTER — Ambulatory Visit: Payer: BC Managed Care – PPO | Admitting: Orthopaedic Surgery

## 2023-11-03 DIAGNOSIS — S82122A Displaced fracture of lateral condyle of left tibia, initial encounter for closed fracture: Secondary | ICD-10-CM | POA: Diagnosis not present

## 2023-11-03 NOTE — Progress Notes (Addendum)
   Office Visit Note   Patient: Kevin Robbins           Date of Birth: 02-07-1991           MRN: [id number] Visit Date: 11/03/2023              Requested by: Myrlene Broker, MD [address] [place],  [place] [address] PCP: Myrlene Broker, MD   Assessment & Plan: Visit Diagnoses:  1. Closed fracture of lateral portion of left tibial plateau, initial encounter     Plan: Kevin Robbins is a 32 year old gentleman who 4 weeks status post nondisplaced lateral tibial plateau fracture.  I reviewed the CT scan with him and fortunately shows congruent joint line.  We will continue to treat this nonoperatively.  At this point I think it is fine to allow him to weight-bear with a single-point crutch to offload it.   Hinged knee brace provided.  I would like to recheck in 2 weeks with repeat two-view x-rays of the left knee.  If x-rays look good then he can advance weightbearing at that time.  Follow-Up Instructions: Return in about 2 weeks (around 11/17/2023).   Orders:  No orders of the defined types were placed in this encounter.  No orders of the defined types were placed in this encounter.     Procedures: No procedures performed   Clinical Data: No additional findings.   Subjective: Chief Complaint  Patient presents with   Left Knee - Routine Post Op    HPI Kevin Robbins returns today for CT scan review.  He is feels much better. Review of Systems   Objective: Vital Signs: There were no vitals taken for this visit.  Physical Exam  Ortho Exam Exam of the left knee shows significant improvement in range of motion and swelling.  Slight tenderness to the lateral tibial plateau. Specialty Comments:  No specialty comments available.  Imaging: No results found.   PMFS History: Patient Active Problem List   Diagnosis Date Noted   Lipoma of back 11/08/2019   Lipoma of right shoulder 11/08/2019   Acute neck pain 06/20/2019   Acute back pain 10/22/2018   Routine general  medical examination at a health care facility 08/02/2018   Thrombocytopenia ([facility]) 12/20/2014   Past Medical History:  Diagnosis Date   Von Willebrand disease ([facility])     Family History  Problem Relation Age of Onset   Drug abuse Other    Von Willebrand disease Other     Past Surgical History:  Procedure Laterality Date   KNEE SURGERY Left 2017   KNEE SURGERY Right 2018   LIPOMA EXCISION Right 11/23/2019   Procedure: RIGHT SHOUDLER LIPOMA REMOVAL;  Surgeon: Tarry Kos, MD;  Location: [facility];  Service: Orthopedics;  Laterality: Right;   lipoma removal  N/A 2018   Social History   Occupational History   Not on file  Tobacco Use   Smoking status: Never   Smokeless tobacco: Never  Vaping Use   Vaping status: Every Day   Substances: Nicotine  Substance and Sexual Activity   Alcohol use: No   Drug use: No   Sexual activity: Not on file

## 2023-11-17 ENCOUNTER — Encounter: Payer: Self-pay | Admitting: Physician Assistant

## 2023-11-17 ENCOUNTER — Ambulatory Visit: Payer: BC Managed Care – PPO | Admitting: Physician Assistant

## 2023-11-17 ENCOUNTER — Other Ambulatory Visit (INDEPENDENT_AMBULATORY_CARE_PROVIDER_SITE_OTHER): Payer: Self-pay

## 2023-11-17 DIAGNOSIS — S82122A Displaced fracture of lateral condyle of left tibia, initial encounter for closed fracture: Secondary | ICD-10-CM

## 2023-11-17 MED ORDER — TRAMADOL HCL 50 MG PO TABS: 50.0000 mg | ORAL_TABLET | Freq: Two times a day (BID) | ORAL | 1 refills | Status: AC | PRN

## 2023-11-17 NOTE — Progress Notes (Signed)
 Office Visit Note   Patient: Kevin Robbins           Date of Birth: [birth year]           MRN: [id number] Visit Date: 11/17/2023              Requested by: Rollene Almarie LABOR, MD [address] [place],  [place] [address] PCP: Rollene Almarie LABOR, MD   Assessment & Plan: Visit Diagnoses:  1. Closed fracture of lateral portion of left tibial plateau, initial encounter     Plan: Impression is 6 weeks status post left knee lateral tibial plateau fracture.  Patient is clinically and radiographically improving.  Will allow him to begin full weightbearing to the left lower extremity.  I will start him in outpatient physical therapy to work on range of motion and strengthening exercises.  Referrals been made.  I sent in tramadol  to take as needed.  Follow-up in 6 weeks for repeat evaluation and 2 view x-rays of the left knee.  Call with concerns or questions.  Follow-Up Instructions: Return in about 6 weeks (around 12/29/2023).   Orders:  Orders Placed This Encounter  Procedures   XR Knee 1-2 Views Left   Ambulatory referral to Physical Therapy   Meds ordered this encounter  Medications   traMADol  (ULTRAM ) 50 MG tablet    Sig: Take 1 tablet (50 mg total) by mouth 2 (two) times daily as needed.    Dispense:  30 tablet    Refill:  1      Procedures: No procedures performed   Clinical Data: No additional findings.   Subjective: Chief Complaint  Patient presents with   Left Knee - Follow-up    Tibial plateau fracture    HPIPatient is a pleasant 32 year old gentleman who comes in today approximately 6 weeks status post nondisplaced lateral tibial plateau fracture.  He has been doing better.  He has been mostly weightbearing with 1 crutch.  He is taking ibuprofen  and an occasional oxycodone .     Objective: Vital Signs: There were no vitals taken for this visit.    Ortho Exam left knee exam: He does have tenderness to the posterior lateral joint line.  Range of motion  0 to 115 degrees.  He is neurovascularly intact distally.  Specialty Comments:  No specialty comments available.  Imaging: XR Knee 1-2 Views Left Result Date: 11/17/2023 X-rays demonstrate stable alignment of the fracture with evidence of consolidation    PMFS History: Patient Active Problem List   Diagnosis Date Noted   Lipoma of back 11/08/2019   Lipoma of right shoulder 11/08/2019   Acute neck pain 06/20/2019   Acute back pain 10/22/2018   Routine general medical examination at a health care facility 08/02/2018   Thrombocytopenia ([facility]) 12/20/2014   Past Medical History:  Diagnosis Date   Von Willebrand disease ([facility])     Family History  Problem Relation Age of Onset   Drug abuse Other    Von Willebrand disease Other     Past Surgical History:  Procedure Laterality Date   KNEE SURGERY Left 2017   KNEE SURGERY Right 2018   LIPOMA EXCISION Right 11/23/2019   Procedure: RIGHT SHOUDLER LIPOMA REMOVAL;  Surgeon: Jerri Kay HERO, MD;  Location: [facility];  Service: Orthopedics;  Laterality: Right;   lipoma removal  N/A 2018   Social History   Occupational History   Not on file  Tobacco Use   Smoking status: Never   Smokeless  tobacco: Never  Vaping Use   Vaping status: Every Day   Substances: Nicotine  Substance and Sexual Activity   Alcohol use: No   Drug use: No   Sexual activity: Not on file

## 2023-12-28 NOTE — Progress Notes (Signed)
   Office Visit Note   Patient: Kevin Robbins           Date of Birth: 07/06/1991           MRN: [id number] Visit Date: 12/29/2023              Requested by: Myrlene Broker, MD [address] [place],  [place] [address] PCP: Myrlene Broker, MD   Assessment & Plan: Visit Diagnoses:  1. Closed fracture of lateral portion of left tibial plateau, initial encounter     Plan: At this point Kevin Robbins is 3 months from a minimally depressed left lateral tibial plateau fracture.  He he is experiencing some symptoms of posttraumatic arthritis.  His knee is functional mainly has pain with terminal extension and flexion which I would expect to resolve with more time.  I will refill tramadol for breakthrough pain.  Otherwise he can follow-up as needed.  Activity as tolerated.  Follow-Up Instructions: No follow-ups on file.   Orders:  Orders Placed This Encounter  Procedures   XR Knee 1-2 Views Left   Meds ordered this encounter  Medications   traMADol (ULTRAM) 50 MG tablet    Sig: Take 1-2 tablets (50-100 mg total) by mouth daily as needed.    Dispense:  20 tablet    Refill:  0      Procedures: No procedures performed   Clinical Data: No additional findings.   Subjective: Chief Complaint  Patient presents with   Left Knee - Follow-up    Tibial plateau fracture    HPI Kevin Robbins returns for follow-up evaluation of a left tibial plateau fracture.  Mainly has pain with terminal extension and flexion. Review of Systems   Objective: Vital Signs: There were no vitals taken for this visit.  Physical Exam  Ortho Exam Examination of left knee shows full range of motion.  No joint effusion. Specialty Comments:  No specialty comments available.  Imaging: XR Knee 1-2 Views Left Result Date: 12/29/2023 X-rays of the left knee show a healed mildly depressed lateral tibial plateau fracture.    PMFS History: Patient Active Problem List   Diagnosis Date Noted   Lipoma of  back 11/08/2019   Lipoma of right shoulder 11/08/2019   Acute neck pain 06/20/2019   Acute back pain 10/22/2018   Routine general medical examination at a health care facility 08/02/2018   Thrombocytopenia ([facility]) 12/20/2014   Past Medical History:  Diagnosis Date   Von Willebrand disease ([facility])     Family History  Problem Relation Age of Onset   Drug abuse Other    Von Willebrand disease Other     Past Surgical History:  Procedure Laterality Date   KNEE SURGERY Left 2017   KNEE SURGERY Right 2018   LIPOMA EXCISION Right 11/23/2019   Procedure: RIGHT SHOUDLER LIPOMA REMOVAL;  Surgeon: Tarry Kos, MD;  Location: [facility];  Service: Orthopedics;  Laterality: Right;   lipoma removal  N/A 2018   Social History   Occupational History   Not on file  Tobacco Use   Smoking status: Never   Smokeless tobacco: Never  Vaping Use   Vaping status: Every Day   Substances: Nicotine  Substance and Sexual Activity   Alcohol use: No   Drug use: No   Sexual activity: Not on file

## 2023-12-29 ENCOUNTER — Other Ambulatory Visit (INDEPENDENT_AMBULATORY_CARE_PROVIDER_SITE_OTHER): Payer: Self-pay

## 2023-12-29 ENCOUNTER — Encounter: Payer: Self-pay | Admitting: Orthopaedic Surgery

## 2023-12-29 ENCOUNTER — Ambulatory Visit: Payer: BC Managed Care – PPO | Admitting: Orthopaedic Surgery

## 2023-12-29 DIAGNOSIS — S82122A Displaced fracture of lateral condyle of left tibia, initial encounter for closed fracture: Secondary | ICD-10-CM

## 2023-12-29 MED ORDER — TRAMADOL HCL 50 MG PO TABS: 50.0000 mg | ORAL_TABLET | Freq: Every day | ORAL | 0 refills | Status: AC | PRN

## 2024-09-19 ENCOUNTER — Encounter: Payer: Self-pay | Admitting: Radiology
